# Patient Record
Sex: Male | Born: 1980 | Race: White | Hispanic: No | Marital: Married | Smoking: Never smoker
Health system: Southern US, Community
[De-identification: ages and names within clinical notes are randomized; demographics above are authoritative.]

## PROBLEM LIST (undated history)

## (undated) DIAGNOSIS — IMO0001 Reserved for inherently not codable concepts without codable children: Secondary | ICD-10-CM

## (undated) HISTORY — DX: Reserved for inherently not codable concepts without codable children: IMO0001

---

## 2011-06-06 ENCOUNTER — Encounter: Payer: Self-pay | Admitting: Family Medicine

## 2011-06-20 ENCOUNTER — Ambulatory Visit (INDEPENDENT_AMBULATORY_CARE_PROVIDER_SITE_OTHER): Payer: 59 | Admitting: Family Medicine

## 2011-06-20 ENCOUNTER — Encounter: Payer: Self-pay | Admitting: Family Medicine

## 2011-06-20 DIAGNOSIS — M549 Dorsalgia, unspecified: Secondary | ICD-10-CM

## 2011-06-20 DIAGNOSIS — Z719 Counseling, unspecified: Secondary | ICD-10-CM | POA: Insufficient documentation

## 2011-06-20 DIAGNOSIS — E663 Overweight: Secondary | ICD-10-CM | POA: Insufficient documentation

## 2011-06-20 DIAGNOSIS — S29012A Strain of muscle and tendon of back wall of thorax, initial encounter: Secondary | ICD-10-CM | POA: Insufficient documentation

## 2011-06-20 DIAGNOSIS — R030 Elevated blood-pressure reading, without diagnosis of hypertension: Secondary | ICD-10-CM

## 2011-06-20 DIAGNOSIS — I10 Essential (primary) hypertension: Secondary | ICD-10-CM | POA: Insufficient documentation

## 2011-06-20 DIAGNOSIS — Z7189 Other specified counseling: Secondary | ICD-10-CM

## 2011-06-20 DIAGNOSIS — Z6825 Body mass index (BMI) 25.0-25.9, adult: Secondary | ICD-10-CM

## 2011-06-20 NOTE — Assessment & Plan Note (Addendum)
Most likely discomfort in left upper back and in neck is musculoskeletal in origin. Currently exam within normal limits completely. Recommend Motrin as needed if symptoms flare. We'll give shoulder exercises for patient to do at home for strengthening and stretching. If no improvement or if any worsening patient to return and I can refer him to formal physical therapy

## 2011-06-20 NOTE — Assessment & Plan Note (Signed)
Encouraged weight loss and increased activity. Patient is a nonsmoker. Up-to-date on vaccinations. No screening blood work recommended at this time.

## 2011-06-20 NOTE — Progress Notes (Signed)
  Subjective:    Patient ID: Derrick Jensen, male    DOB: 13-Sep-1980, 30 y.o.   MRN: [id number]  HPI Here for annual physical: Possible history, surgical history, family history, social history, medications, allergies are reviewed and documented under the appropriate tabs.  Weight: Patient currently with a BMI of 30. Has not been working out during the past year as he has in the past. Patient states that he has gained 30 pounds during the past year. Patient goal weight of 200 pounds. He is currently at 230 pounds. States that he is trying to get active again. Also endorses an unhealthy diet and he is working on.  Blood pressure: 148/94, rechecked at end of visit remained same. Patient states he has no history of blood pressure issues. Does not check blood pressure at home. Has not been working out currently as per above.  Shoulder and neck discomfort: Patient describes a burning/dull ache in left scapular area and another similar discomfort in left neck area. Both areas of discomfort are small, approximately a quarter in size. Discomfort increases with patient sitting at rest in office desk or lying down. No discomfort with movement and exercise. Patient states this does not hurt. Area and left scapula there for over one year. Left neck discomfort started in April.   Review of Systems As per above. No weight loss. No fever. No cold symptoms. No dark stools. No bright red blood per rectum. No abdominal pain.    Objective:   Physical Exam  Constitutional: He is oriented to person, place, and time. He appears well-developed and well-nourished.  HENT:  Head: Normocephalic and atraumatic.  Eyes: Conjunctivae are normal. Pupils are equal, round, and reactive to light.  Neck: Normal range of motion. Neck supple.  Cardiovascular: Normal rate, regular rhythm and normal heart sounds.   No murmur heard. Pulmonary/Chest: Effort normal and breath sounds normal. No respiratory distress. He has no  wheezes.  Abdominal: Soft. He exhibits no distension.  Musculoskeletal: He exhibits no edema.       No tenderness on palpation of shoulder area, upper back, or spine. No tenderness on palpation of neck. No skin changes on back or shoulders. Normal sensation throughout. Reflexes equal bilateral in lower extremities. Range of motion within normal limits and shoulders bilateral. Strength 5 out of 5 in upper extremities, and equal bilateral.  Neurological: He is alert and oriented to person, place, and time.  Skin: No rash noted.  Psychiatric: He has a normal mood and affect. His behavior is normal.          Assessment & Plan:

## 2011-06-20 NOTE — Patient Instructions (Signed)
Blood pressure: It was 148/94 today.  Goal is for it to be less than 140/90.  Keep a log book of your blood pressures--if it stays elevated during the next couple of month at home- return for recheck in 2 months.  If it is less than 140/90 than follow up in 1 year for recheck.   Shoulder/neck pain: I think that your discomfort is due to inflammation in these muscles.  Use motrin as needed for discomfort. Do home exercises 2 x day to see if this improves.   If it doesn't improve with home PT, call me and I will refer you to formal PT.  Return if any new or worsening of symptoms.   Weight: Goal weight 200.  Your BMI is currently 30.  Continue to work on increasing exercise and improving nutrition.     Do Shoulder Exercises 2 x per day.   EXERCISES  RANGE OF MOTION (ROM) AND STRETCHING EXERCISES These exercises may help you when beginning to rehabilitate your injury. Your symptoms may resolve with or without further involvement from your physician, physical therapist or athletic trainer. While completing these exercises, remember:   Restoring tissue flexibility helps normal motion to return to the joints. This allows healthier, less painful movement and activity.   An effective stretch should be held for at least 30 seconds.   A stretch should never be painful. You should only feel a gentle lengthening or release in the stretched tissue.  ROM - Pendulum  Bend at the waist so that your right / left arm falls away from your body. Support yourself with your opposite hand on a solid surface, such as a table or a countertop.   Your right / left arm should be perpendicular to the ground. If it is not perpendicular, you need to lean over farther. Relax the muscles in your right / left arm and shoulder as much as possible.   Gently sway your hips and trunk so they move your right / left arm without any use of your right / left shoulder muscles.   Progress your movements so that your right /  left arm moves side to side, then forward and backward, and finally, both clockwise and counterclockwise.   Complete __________ repetitions in each direction. Many people use this exercise to relieve discomfort in their shoulder as well as to gain range of motion.  Repeat __________ times. Complete this exercise __________ times per day. STRETCH - Flexion, Standing  Stand with good posture. With an underhand grip on your right / left hand and an overhand grip on the opposite hand, grasp a broomstick or cane so that your hands are a little more than shoulder-width apart.   Keeping your right / left elbow straight and shoulder muscles relaxed, push the stick with your opposite hand to raise your right / left arm in front of your body and then overhead. Raise your arm until you feel a stretch in your right / left shoulder, but before you have increased shoulder pain.   Try to avoid shrugging your right / left shoulder as your arm rises by keeping your shoulder blade tucked down and toward your mid-back spine. Hold __________ seconds.   Slowly return to the starting position.  Repeat __________ times. Complete this exercise __________ times per day. STRETCH - Internal Rotation  Place your right / left hand behind your back, palm-up.   Throw a towel or belt over your opposite shoulder. Grasp the towel/belt with your right /  left hand.   While keeping an upright posture, gently pull up on the towel/belt until you feel a stretch in the front of your right / left shoulder.   Avoid shrugging your right / left shoulder as your arm rises by keeping your shoulder blade tucked down and toward your mid-back spine.   Hold __________. Release the stretch by lowering your opposite hand.  Repeat __________ times. Complete this exercise __________ times per day. STRETCH - External Rotation and Abduction  Stagger your stance through a doorframe. It does not matter which foot is forward.   As instructed by  your physician, physical therapist or athletic trainer, place your hands:   And forearms above your head and on the door frame.   And forearms at head-height and on the door frame.   At elbow-height and on the door frame.   Keeping your head and chest upright and your stomach muscles tight to prevent over-extending your low-back, slowly shift your weight onto your front foot until you feel a stretch across your chest and/or in the front of your shoulders.   Hold __________ seconds. Shift your weight to your back foot to release the stretch.  Repeat __________ times. Complete this stretch __________ times per day.  STRENGTHENING EXERCISES  These exercises may help you when beginning to rehabilitate your injury. They may resolve your symptoms with or without further involvement from your physician, physical therapist or athletic trainer. While completing these exercises, remember:   Muscles can gain both the endurance and the strength needed for everyday activities through controlled exercises.   Complete these exercises as instructed by your physician, physical therapist or athletic trainer. Progress the resistance and repetitions only as guided.   You may experience muscle soreness or fatigue, but the pain or discomfort you are trying to eliminate should never worsen during these exercises. If this pain does worsen, stop and make certain you are following the directions exactly. If the pain is still present after adjustments, discontinue the exercise until you can discuss the trouble with your clinician.   If advised by your physician, during your recovery, avoid activity or exercises which involve actions that place your right / left hand or elbow above your head or behind your back or head. These positions stress the tissues which are trying to heal.  STRENGTH - Scapular Depression and Adduction  With good posture, sit on a firm chair. Supported your arms in front of you with pillows, arm  rests or a table top. Have your elbows in line with the sides of your body.   Gently draw your shoulder blades down and toward your mid-back spine. Gradually increase the tension without tensing the muscles along the top of your shoulders and the back of your neck.   Hold for __________ seconds. Slowly release the tension and relax your muscles completely before completing the next repetition.   After you have practiced this exercise, remove the arm support and complete it in standing as well as sitting.  Repeat __________ times. Complete this exercise __________ times per day.  STRENGTH - External Rotators  Secure a rubber exercise band/tubing to a fixed object so that it is at the same height as your right / left elbow when you are standing or sitting on a firm surface.   Stand or sit so that the secured exercise band/tubing is at your side that is not injured.   Bend your elbow 90 degrees. Place a folded towel or small pillow under your  right / left arm so that your elbow is a few inches away from your side.   Keeping the tension on the exercise band/tubing, pull it away from your body, as if pivoting on your elbow. Be sure to keep your body steady so that the movement is only coming from your shoulder rotating.   Hold __________ seconds. Release the tension in a controlled manner as you return to the starting position.  Repeat __________ times. Complete this exercise __________ times per day.  STRENGTH - Supraspinatus  Stand or sit with good posture. Grasp a __________ weight or an exercise band/tubing so that your hand is "thumbs-up," like when you shake hands.   Slowly lift your right / left hand from your thigh into the air, traveling about 30 degrees from straight out at your side. Lift your hand to shoulder height or as far as you can without increasing any shoulder pain. Initially, many people do not lift their hands above shoulder height.   Avoid shrugging your right / left  shoulder as your arm rises by keeping your shoulder blade tucked down and toward your mid-back spine.   Hold for __________ seconds. Control the descent of your hand as you slowly return to your starting position.  Repeat __________ times. Complete this exercise __________ times per day.  STRENGTH - Shoulder Extensors  Secure a rubber exercise band/tubing so that it is at the height of your shoulders when you are either standing or sitting on a firm arm-less chair.   With a thumbs-up grip, grasp an end of the band/tubing in each hand. Straighten your elbows and lift your hands straight in front of you at shoulder height. Step back away from the secured end of band/tubing until it becomes tense.   Squeezing your shoulder blades together, pull your hands down to the sides of your thighs. Do not allow your hands to go behind you.   Hold for __________ seconds. Slowly ease the tension on the band/tubing as you reverse the directions and return to the starting position.  Repeat __________ times. Complete this exercise __________ times per day.  STRENGTH - Scapular Retractors  Secure a rubber exercise band/tubing so that it is at the height of your shoulders when you are either standing or sitting on a firm arm-less chair.   With a palm-down grip, grasp an end of the band/tubing in each hand. Straighten your elbows and lift your hands straight in front of you at shoulder height. Step back away from the secured end of band/tubing until it becomes tense.   Squeezing your shoulder blades together, draw your elbows back as you bend them. Keep your upper arm lifted away from your body throughout the exercise.   Hold __________ seconds. Slowly ease the tension on the band/tubing as you reverse the directions and return to the starting position.  Repeat __________ times. Complete this exercise __________ times per day. STRENGTH - Scapular Depressors  Find a sturdy chair without wheels, such as a from a  dining room table.   Keeping your feet on the floor, lift your bottom from the seat and lock your elbows.   Keeping your elbows straight, allow gravity to pull your body weight down. Your shoulders will rise toward your ears.   Raise your body against gravity by drawing your shoulder blades down your back, shortening the distance between your shoulders and ears. Although your feet should always maintain contact with the floor, your feet should progressively support less body weight as  you get stronger.   Hold __________ seconds. In a controlled and slow manner, lower your body weight to begin the next repetition.  Repeat __________ times. Complete this exercise __________ times per day.  Document Released: 05/31/2005 Document Revised: 03/29/2011 Document Reviewed: 10/29/2008 [facility] Patient Information 2012 [place], [place].

## 2011-06-20 NOTE — Assessment & Plan Note (Signed)
Blood pressure 148/94. Patient not checking at home. Patient to monitor blood pressure and return if it remains elevated at home. Blood pressure goal of less than 140/90.

## 2011-06-20 NOTE — Assessment & Plan Note (Signed)
Patient states weight goal of 200 pounds.  BMI currently 30. Weight currently 230 pounds.  Encouraged increased activity and healthy diet.

## 2013-01-02 ENCOUNTER — Ambulatory Visit (INDEPENDENT_AMBULATORY_CARE_PROVIDER_SITE_OTHER): Payer: 59 | Admitting: Family Medicine

## 2013-01-02 ENCOUNTER — Ambulatory Visit (INDEPENDENT_AMBULATORY_CARE_PROVIDER_SITE_OTHER)
Admission: RE | Admit: 2013-01-02 | Discharge: 2013-01-02 | Disposition: A | Discharge status: Home / Self Care | Payer: 59 | Source: Ambulatory Visit | Attending: Family Medicine | Admitting: Family Medicine

## 2013-01-02 ENCOUNTER — Encounter: Payer: Self-pay | Admitting: Family Medicine

## 2013-01-02 ENCOUNTER — Ambulatory Visit: Payer: 59 | Admitting: Family Medicine

## 2013-01-02 VITALS — BP 140/84 | HR 84 | Temp 98.0°F | Ht 71.25 in | Wt 226.0 lb

## 2013-01-02 DIAGNOSIS — M25512 Pain in left shoulder: Secondary | ICD-10-CM

## 2013-01-02 DIAGNOSIS — R030 Elevated blood-pressure reading, without diagnosis of hypertension: Secondary | ICD-10-CM

## 2013-01-02 DIAGNOSIS — Z6825 Body mass index (BMI) 25.0-25.9, adult: Secondary | ICD-10-CM

## 2013-01-02 DIAGNOSIS — E663 Overweight: Secondary | ICD-10-CM

## 2013-01-02 DIAGNOSIS — M25519 Pain in unspecified shoulder: Secondary | ICD-10-CM

## 2013-01-02 DIAGNOSIS — Z8249 Family history of ischemic heart disease and other diseases of the circulatory system: Secondary | ICD-10-CM

## 2013-01-02 DIAGNOSIS — Z136 Encounter for screening for cardiovascular disorders: Secondary | ICD-10-CM

## 2013-01-02 LAB — CBC WITH DIFFERENTIAL/PLATELET
Basophils Relative: 0.6 % (ref 0.0–3.0)
Eosinophils Relative: 2.1 % (ref 0.0–5.0)
Hemoglobin: 16.3 g/dL (ref 13.0–17.0)
Lymphocytes Relative: 21.3 % (ref 12.0–46.0)
Neutrophils Relative %: 67.3 % (ref 43.0–77.0)
RBC: 5.45 Mil/uL (ref 4.22–5.81)
WBC: 6.2 10*3/uL (ref 4.5–10.5)

## 2013-01-02 LAB — COMPREHENSIVE METABOLIC PANEL
ALT: 61 U/L — ABNORMAL HIGH (ref 0–53)
AST: 34 U/L (ref 0–37)
Calcium: 9.5 mg/dL (ref 8.4–10.5)
Chloride: 103 mEq/L (ref 96–112)
Creatinine, Ser: 1.2 mg/dL (ref 0.4–1.5)
Sodium: 140 mEq/L (ref 135–145)
Total Bilirubin: 0.9 mg/dL (ref 0.3–1.2)
Total Protein: 7.3 g/dL (ref 6.0–8.3)

## 2013-01-02 LAB — LIPID PANEL
HDL: 25.3 mg/dL — ABNORMAL LOW (ref >39.00)
VLDL: 78.8 mg/dL — ABNORMAL HIGH (ref 0.0–40.0)

## 2013-01-02 MED ORDER — HYDROCHLOROTHIAZIDE 12.5 MG PO CAPS: 12.5000 mg | ORAL_CAPSULE | ORAL | Status: DC

## 2013-01-02 NOTE — Progress Notes (Addendum)
Subjective:    Patient ID: Derrick Jensen, male    DOB: 1981-04-05, 32 y.o.   MRN: [id number]  HPI  Very pleasant 32 yo male here to establish care.  1.  Elevated BP- think it has been elevated for awhile.  Strong FH of HTN.  Saw Dawn Caviness in 06/2011 and BP was 148/94 at that time. BP is 140/84 today.  At times does feel face is a little flushed with exertion, has felt heart flutter. No HA, blurred vision or LE edema.  2.  Left posterior shoulder pain- ongoing for years.  Comes and goes but when it is happening it is constant.  No movement makes it worse.  Can radiate to arm but not fingers/hand. No known injury.  3.  FH of CAD- dad had first MI in 36s.  Paternal grandfather died of MI in his 54s. He denies any significant DOE- states he is "out of shape."  Knows he is overweight and trying to exercise more regularly.  Patient Active Problem List   Diagnosis Date Noted  . Family history of premature CAD 01/02/2013  . Left shoulder pain 01/02/2013  . Back pain 06/20/2011  . Overweight (BMI 25.0-29.9) 06/20/2011  . Blood pressure elevated 06/20/2011  . Health counseling 06/20/2011   Past Medical History  Diagnosis Date  . No significant past medical history    No past surgical history on file. History  Substance Use Topics  . Smoking status: Never Smoker   . Smokeless tobacco: Not on file  . Alcohol Use: Yes     Comment: 1 drink every 3 weeks   Family History  Problem Relation Age of Onset  . Diabetes Father     type 2  . Cancer Father     Stage 4 throat cancer  . Hyperlipidemia Father   . Hypertension Father   . Heart disease Father     MI x2, CAD  . Alzheimer's disease Father    No Known Allergies No current outpatient prescriptions on file prior to visit.   No current facility-administered medications on file prior to visit.   The PMH, PSH, Social History, Family History, Medications, and allergies have been reviewed in [facility], and have been updated if  relevant.     Review of Systems See HPI Patient reports no  vision/ hearing changes,anorexia, weight change, fever ,adenopathy, persistant / recurrent hoarseness, swallowing issues, chest pain, edema,persistant / recurrent cough, hemoptysis, dyspnea(rest, exertional, paroxysmal nocturnal), gastrointestinal  bleeding (melena, rectal bleeding), abdominal pain, excessive heart burn, GU symptoms(dysuria, hematuria, pyuria, voiding/incontinence  Issues) syncope, focal weakness, severe memory loss, concerning skin lesions, depression, anxiety, abnormal bruising/bleeding, major joint swelling.       Objective:   Physical Exam BP 140/84  Pulse 84  Temp(Src) 98 F (36.7 C)  Ht 5' 11.25" (1.81 m)  Wt 226 lb (102.513 kg)  BMI 31.29 kg/m2 General:  overweght male in NAD Eyes:  PERRL Ears:  External ear exam shows no significant lesions or deformities.  Otoscopic examination reveals clear canals, tympanic membranes are intact bilaterally without bulging, retraction, inflammation or discharge. Hearing is grossly normal bilaterally. Nose:  External nasal examination shows no deformity or inflammation. Nasal mucosa are pink and moist without lesions or exudates. Mouth:  Oral mucosa and oropharynx without lesions or exudates.  Teeth in good repair. Neck:  no carotid bruit or thyromegaly no cervical or supraclavicular lymphadenopathy  Lungs:  Normal respiratory effort, chest expands symmetrically. Lungs are clear to auscultation, no crackles or wheezes.  Heart:  Normal rate and regular rhythm. S1 and S2 normal without gallop, murmur, click, rub or other extra sounds. Abdomen:  Bowel sounds positive,abdomen soft and non-tender without masses, organomegaly or hernias noted. Extremities:  no edema  MSK: shoulder is normal to inspection and palpation, neg arch, neg empty can.    Assessment & Plan:  1. Overweight (BMI 25.0-29.9) He is becoming more active, trying to lose weight.  2. Blood pressure  elevated Given persistent elevation of BP and strong FH, will start HCTZ 12.5 mg daily. Follow up in 2 weeks. - Comprehensive metabolic panel - CBC with Differential  3. Family history of premature CAD EKG reassuring- NSR. Will check lipid panel for further risk stratification. Non smoker. - EKG 12-Lead  4. Left shoulder pain Chronic- seems more subscapular.  Will check xray today.  Likely refer to Dr. Patsy Lager for further work up. - DG Shoulder Left; Future  5. Screening for ischemic heart disease  - Lipid Panel

## 2013-01-02 NOTE — Patient Instructions (Addendum)
It was very nice to meet you, Derrick Jensen. I will call you with your xray and labs results.  We are starting Hydrochlorothiazide 12.5 mg daily- please follow up with me in 2 weeks.

## 2013-01-16 ENCOUNTER — Encounter: Payer: Self-pay | Admitting: Family Medicine

## 2013-01-16 ENCOUNTER — Ambulatory Visit (INDEPENDENT_AMBULATORY_CARE_PROVIDER_SITE_OTHER): Payer: 59 | Admitting: Family Medicine

## 2013-01-16 ENCOUNTER — Encounter: Payer: Self-pay | Admitting: *Deleted

## 2013-01-16 VITALS — BP 102/82 | HR 76 | Temp 98.1°F | Wt 228.0 lb

## 2013-01-16 DIAGNOSIS — I10 Essential (primary) hypertension: Secondary | ICD-10-CM

## 2013-01-16 DIAGNOSIS — E781 Pure hyperglyceridemia: Secondary | ICD-10-CM

## 2013-01-16 LAB — LDL CHOLESTEROL, DIRECT: Direct LDL: 122.5 mg/dL

## 2013-01-16 LAB — BASIC METABOLIC PANEL
CO2: 28 mEq/L (ref 19–32)
Chloride: 101 mEq/L (ref 96–112)
Glucose, Bld: 102 mg/dL — ABNORMAL HIGH (ref 70–99)
Potassium: 4.1 mEq/L (ref 3.5–5.1)
Sodium: 138 mEq/L (ref 135–145)

## 2013-01-16 LAB — LIPID PANEL
HDL: 32.2 mg/dL — ABNORMAL LOW (ref >39.00)
Total CHOL/HDL Ratio: 6
VLDL: 45.8 mg/dL — ABNORMAL HIGH (ref 0.0–40.0)

## 2013-01-16 NOTE — Patient Instructions (Addendum)
Great to see you. We will contact you with your lab results.

## 2013-01-16 NOTE — Progress Notes (Signed)
  Subjective:    Patient ID: Derrick Jensen, male    DOB: January 24, 1981, 32 y.o.   MRN: [id number]  HPI  Very pleasant 32 yo male here for two week follow up.  HTN- BP has been elevated for several years and he was symptomatic. Started HCTZ 12.5 mg daily. Denies any HA, blurred vision, CP or SOB.  Feels like BP is under better control.  TG were elevated but he was not fasting.  He is fasting today.  Lab Results  Component Value Date   CHOL 181 01/02/2013   HDL 25.30* 01/02/2013   LDLDIRECT 106.4 01/02/2013   TRIG 394.0* 01/02/2013   CHOLHDL 7 01/02/2013    Patient Active Problem List   Diagnosis Date Noted  . Family history of premature CAD 01/02/2013  . Left shoulder pain 01/02/2013  . Back pain 06/20/2011  . Overweight (BMI 25.0-29.9) 06/20/2011  . HTN (hypertension) 06/20/2011  . Health counseling 06/20/2011   Past Medical History  Diagnosis Date  . No significant past medical history    No past surgical history on file. History  Substance Use Topics  . Smoking status: Never Smoker   . Smokeless tobacco: Not on file  . Alcohol Use: Yes     Comment: 1 drink every 3 weeks   Family History  Problem Relation Age of Onset  . Diabetes Father     type 2  . Cancer Father     Stage 4 throat cancer  . Hyperlipidemia Father   . Hypertension Father   . Heart disease Father     MI x2, CAD  . Alzheimer's disease Father    No Known Allergies Current Outpatient Prescriptions on File Prior to Visit  Medication Sig Dispense Refill  . hydrochlorothiazide (MICROZIDE) 12.5 MG capsule Take 1 capsule (12.5 mg total) by mouth every morning.  30 capsule  0   No current facility-administered medications on file prior to visit.   The PMH, PSH, Social History, Family History, Medications, and allergies have been reviewed in [facility], and have been updated if relevant.     Review of Systems See HPI       Objective:   Physical Exam BP 102/82  Pulse 76  Temp(Src) 98.1 F (36.7 C)  Wt  228 lb (103.42 kg)  BMI 31.57 kg/m2 General:  overweght male in NAD Eyes:  PERRL Ears:  External ear exam shows no significant lesions or deformities.  Otoscopic examination reveals clear canals, tympanic membranes are intact bilaterally without bulging, retraction, inflammation or discharge. Hearing is grossly normal bilaterally. Nose:  External nasal examination shows no deformity or inflammation. Nasal mucosa are pink and moist without lesions or exudates. Mouth:  Oral mucosa and oropharynx without lesions or exudates.  Teeth in good repair. Neck:  no carotid bruit or thyromegaly no cervical or supraclavicular lymphadenopathy  Lungs:  Normal respiratory effort, chest expands symmetrically. Lungs are clear to auscultation, no crackles or wheezes. Heart:  Normal rate and regular rhythm. S1 and S2 normal without gallop, murmur, click, rub or other extra sounds. Abdomen:  Bowel sounds positive,abdomen soft and non-tender without masses, organomegaly or hernias noted. Extremities:  no edema      Assessment & Plan:   1. HTN (hypertension) Much improved! Continue current dose of HCTZ. - Basic Metabolic Panel  2. Hypertriglyceridemia Recheck fasting labs today. - Lipid Panel

## 2013-01-30 ENCOUNTER — Other Ambulatory Visit: Payer: Self-pay | Admitting: Family Medicine

## 2013-05-15 ENCOUNTER — Other Ambulatory Visit: Payer: Self-pay

## 2013-05-15 MED ORDER — HYDROCHLOROTHIAZIDE 12.5 MG PO CAPS: ORAL_CAPSULE | ORAL | Status: DC

## 2013-05-15 NOTE — Telephone Encounter (Signed)
Pt is going to start using a mail order pharmacy (pt does not know name of pharmacy yet). Pt request 90 day written rx for HCTZ. Call when ready for pick up.

## 2013-05-16 MED ORDER — HYDROCHLOROTHIAZIDE 12.5 MG PO CAPS: ORAL_CAPSULE | ORAL | Status: DC

## 2013-05-16 NOTE — Telephone Encounter (Signed)
RX placed at front desk and pt notified. 

## 2013-05-16 NOTE — Addendum Note (Signed)
Addended by: Criselda Peaches B on: 05/16/2013 01:37 PM   Modules accepted: Orders

## 2013-05-19 NOTE — Telephone Encounter (Signed)
Pt request status of prescription; advised ready for pick up at front desk.

## 2014-01-21 ENCOUNTER — Other Ambulatory Visit: Payer: Self-pay | Admitting: *Deleted

## 2014-02-23 ENCOUNTER — Other Ambulatory Visit: Payer: Self-pay | Admitting: Family Medicine

## 2014-02-24 ENCOUNTER — Other Ambulatory Visit: Payer: Self-pay

## 2014-02-24 MED ORDER — HYDROCHLOROTHIAZIDE 12.5 MG PO CAPS: ORAL_CAPSULE | ORAL | Status: DC

## 2014-02-24 NOTE — Telephone Encounter (Signed)
Pt request refill on HCTZ; pt has appt 03/11/14 and [facility] last got refill # 30 on 01/15/14. [facility] and pt will update refills at appt.

## 2014-02-25 ENCOUNTER — Other Ambulatory Visit: Payer: Self-pay | Admitting: Family Medicine

## 2014-02-25 DIAGNOSIS — Z8249 Family history of ischemic heart disease and other diseases of the circulatory system: Secondary | ICD-10-CM

## 2014-02-25 DIAGNOSIS — E781 Pure hyperglyceridemia: Secondary | ICD-10-CM

## 2014-03-04 ENCOUNTER — Other Ambulatory Visit (INDEPENDENT_AMBULATORY_CARE_PROVIDER_SITE_OTHER): Payer: 59

## 2014-03-04 DIAGNOSIS — E781 Pure hyperglyceridemia: Secondary | ICD-10-CM

## 2014-03-04 DIAGNOSIS — I10 Essential (primary) hypertension: Secondary | ICD-10-CM

## 2014-03-04 LAB — COMPREHENSIVE METABOLIC PANEL
ALBUMIN: 4.7 g/dL (ref 3.5–5.2)
ALK PHOS: 62 U/L (ref 39–117)
ALT: 51 U/L (ref 0–53)
AST: 32 U/L (ref 0–37)
BUN: 10 mg/dL (ref 6–23)
CO2: 32 mEq/L (ref 19–32)
Calcium: 9.3 mg/dL (ref 8.4–10.5)
Chloride: 101 mEq/L (ref 96–112)
Creatinine, Ser: 1.2 mg/dL (ref 0.4–1.5)
GFR: 77.3 mL/min (ref >60.00)
GLUCOSE: 93 mg/dL (ref 70–99)
POTASSIUM: 3.7 meq/L (ref 3.5–5.1)
SODIUM: 139 meq/L (ref 135–145)
TOTAL PROTEIN: 7.3 g/dL (ref 6.0–8.3)
Total Bilirubin: 1.4 mg/dL — ABNORMAL HIGH (ref 0.2–1.2)

## 2014-03-04 LAB — LIPID PANEL
CHOL/HDL RATIO: 7
Cholesterol: 193 mg/dL (ref 0–200)
HDL: 26.8 mg/dL — ABNORMAL LOW (ref >39.00)
NonHDL: 166.2
Triglycerides: 268 mg/dL — ABNORMAL HIGH (ref 0.0–149.0)
VLDL: 53.6 mg/dL — ABNORMAL HIGH (ref 0.0–40.0)

## 2014-03-04 LAB — LDL CHOLESTEROL, DIRECT: LDL DIRECT: 135.7 mg/dL

## 2014-03-04 LAB — HEMOGLOBIN A1C: Hgb A1c MFr Bld: 5.2 % (ref 4.6–6.5)

## 2014-03-11 ENCOUNTER — Encounter: Payer: Self-pay | Admitting: Family Medicine

## 2014-03-11 ENCOUNTER — Ambulatory Visit (INDEPENDENT_AMBULATORY_CARE_PROVIDER_SITE_OTHER): Payer: 59 | Admitting: Family Medicine

## 2014-03-11 ENCOUNTER — Telehealth: Payer: Self-pay | Admitting: Family Medicine

## 2014-03-11 VITALS — BP 106/62 | HR 74 | Temp 98.3°F | Ht 71.5 in | Wt 225.0 lb

## 2014-03-11 DIAGNOSIS — E663 Overweight: Secondary | ICD-10-CM

## 2014-03-11 DIAGNOSIS — Z8249 Family history of ischemic heart disease and other diseases of the circulatory system: Secondary | ICD-10-CM

## 2014-03-11 DIAGNOSIS — I10 Essential (primary) hypertension: Secondary | ICD-10-CM

## 2014-03-11 DIAGNOSIS — Z0000 Encounter for general adult medical examination without abnormal findings: Secondary | ICD-10-CM | POA: Insufficient documentation

## 2014-03-11 DIAGNOSIS — E781 Pure hyperglyceridemia: Secondary | ICD-10-CM

## 2014-03-11 MED ORDER — HYDROCHLOROTHIAZIDE 12.5 MG PO CAPS: ORAL_CAPSULE | ORAL | Status: DC

## 2014-03-11 NOTE — Telephone Encounter (Signed)
Relevant patient education assigned to patient using Emmi. ° °

## 2014-03-11 NOTE — Assessment & Plan Note (Signed)
Deteriorated. He does want to work on diet. Hand out given- low cholesterol diet.

## 2014-03-11 NOTE — Progress Notes (Signed)
Subjective:   Patient ID: Derrick Jensen, male    DOB: January 04, 1981, 33 y.o.   MRN: [id number]  Derrick Jensen is a pleasant 33 y.o. year old male who presents to clinic today with Annual Exam  on 03/11/2014  HPI: HTN- On HCTZ 12.5 mg daily.  Denies any HA, blurred vision, CP or SOB. Lab Results  Component Value Date   CREATININE 1.2 03/04/2014   Lab Results  Component Value Date   CHOL 193 03/04/2014   HDL 26.80* 03/04/2014   LDLDIRECT 135.7 03/04/2014   TRIG 268.0* 03/04/2014   CHOLHDL 7 03/04/2014    HLD- deteriorated. Lab Results  Component Value Date   CHOL 193 03/04/2014   HDL 26.80* 03/04/2014   LDLDIRECT 135.7 03/04/2014   TRIG 268.0* 03/04/2014   CHOLHDL 7 03/04/2014   Has strong FH of premature CAD.  He does play soccer once a week.  Never has any CP, SOB, dizziness or syncope.  No current outpatient prescriptions on file prior to visit.   No current facility-administered medications on file prior to visit.    No Known Allergies  Past Medical History  Diagnosis Date  . No significant past medical history     No past surgical history on file.  Family History  Problem Relation Age of Onset  . Diabetes Father     type 2  . Cancer Father     Stage 4 throat cancer  . Hyperlipidemia Father   . Hypertension Father   . Heart disease Father     MI x2, CAD  . Alzheimer's disease Father     History   Social History  . Marital Status: Single    Spouse Name: N/A    Number of Children: N/A  . Years of Education: 16   Occupational History  .  [employer]   Social History Main Topics  . Smoking status: Never Smoker   . Smokeless tobacco: Not on file  . Alcohol Use: Yes     Comment: 1 drink every 3 weeks  . Drug Use: No  . Sexual Activity: Yes    Birth Control/ Protection: Condom     Comment: 1 partner   Other Topics Concern  . Not on file   Social History Narrative   06/2011: Lives with girlfriend and a roommate; college graduate-- went to [employer]-- business  degree-; employed at [employer].   Originally from [place].    The PMH, PSH, Social History, Family History, Medications, and allergies have been reviewed in [facility], and have been updated if relevant.   Review of Systems    See HPI Patient reports no  vision/ hearing changes,anorexia, weight change, fever ,adenopathy, persistant / recurrent hoarseness, swallowing issues, chest pain, edema,persistant / recurrent cough, hemoptysis, dyspnea(rest, exertional, paroxysmal nocturnal), gastrointestinal  bleeding (melena, rectal bleeding), abdominal pain, excessive heart burn, GU symptoms(dysuria, hematuria, pyuria, voiding/incontinence  Issues) syncope, focal weakness, severe memory loss, concerning skin lesions, depression, anxiety, abnormal bruising/bleeding, major joint swelling.    Objective:    BP 106/62  Pulse 74  Temp(Src) 98.3 F (36.8 C) (Oral)  Ht 5' 11.5" (1.816 m)  Wt 225 lb (102.059 kg)  BMI 30.95 kg/m2  SpO2 98%   Physical Exam General:  overweght male in NAD Eyes:  PERRL Ears:  External ear exam shows no significant lesions or deformities.  Otoscopic examination reveals clear canals, tympanic membranes are intact bilaterally without bulging, retraction, inflammation or discharge. Hearing is grossly normal bilaterally. Nose:  External nasal examination shows no deformity or inflammation. Nasal mucosa are pink and moist without lesions or exudates. Mouth:  Oral mucosa and oropharynx without lesions or exudates.  Teeth in good repair. Neck:  no carotid bruit or thyromegaly no cervical or supraclavicular lymphadenopathy  Lungs:  Normal respiratory effort, chest expands symmetrically. Lungs are clear to auscultation, no crackles or wheezes. Heart:  Normal rate and regular rhythm. S1 and S2 normal without gallop, murmur, click, rub or other extra sounds. Abdomen:  Bowel sounds positive,abdomen soft and non-tender without masses, organomegaly or hernias  noted. Pulses:  R and L posterior tibial pulses are full and equal bilaterally  Extremities:  no edema         Assessment & Plan:   Routine general medical examination at a health care facility  Overweight  Essential hypertension  Family history of premature CAD No Follow-up on file.

## 2014-03-11 NOTE — Assessment & Plan Note (Signed)
Discussed dietary changes. Also has low HDL- he would prefer to work on lifestyle changes and follow up in 3-6 months.

## 2014-03-11 NOTE — Assessment & Plan Note (Signed)
Reviewed preventive care protocols, scheduled due services, and updated immunizations Discussed nutrition, exercise, diet, and healthy lifestyle.  

## 2014-03-11 NOTE — Progress Notes (Signed)
Pre visit review using our clinic review tool, if applicable. No additional management support is needed unless otherwise documented below in the visit note. 

## 2014-03-11 NOTE — Patient Instructions (Signed)
Great to see you, Derrick Jensen. Your blood pressure looks good. Let's work on your cholesterol and plan to recheck it in 3-6 months.

## 2014-03-11 NOTE — Assessment & Plan Note (Signed)
Stable on HCTZ 12.5 mg daily. Continue current dose- eRx sent.

## 2014-03-26 ENCOUNTER — Other Ambulatory Visit: Payer: Self-pay | Admitting: Family Medicine

## 2014-09-11 ENCOUNTER — Other Ambulatory Visit: Payer: Self-pay | Admitting: Internal Medicine

## 2014-09-11 DIAGNOSIS — E785 Hyperlipidemia, unspecified: Secondary | ICD-10-CM

## 2014-09-16 ENCOUNTER — Other Ambulatory Visit (INDEPENDENT_AMBULATORY_CARE_PROVIDER_SITE_OTHER): Payer: 59

## 2014-09-16 DIAGNOSIS — E785 Hyperlipidemia, unspecified: Secondary | ICD-10-CM

## 2014-09-16 LAB — COMPREHENSIVE METABOLIC PANEL
ALBUMIN: 4.6 g/dL (ref 3.5–5.2)
ALT: 46 U/L (ref 0–53)
AST: 25 U/L (ref 0–37)
Alkaline Phosphatase: 56 U/L (ref 39–117)
BILIRUBIN TOTAL: 1 mg/dL (ref 0.2–1.2)
BUN: 15 mg/dL (ref 6–23)
CO2: 28 meq/L (ref 19–32)
Calcium: 9.5 mg/dL (ref 8.4–10.5)
Chloride: 105 mEq/L (ref 96–112)
Creatinine, Ser: 1.09 mg/dL (ref 0.40–1.50)
GFR: 83.59 mL/min (ref >60.00)
GLUCOSE: 100 mg/dL — AB (ref 70–99)
POTASSIUM: 4.1 meq/L (ref 3.5–5.1)
SODIUM: 139 meq/L (ref 135–145)
Total Protein: 6.9 g/dL (ref 6.0–8.3)

## 2014-09-16 LAB — LIPID PANEL
Cholesterol: 174 mg/dL (ref 0–200)
HDL: 29.1 mg/dL — AB (ref >39.00)
LDL CALC: 107 mg/dL — AB (ref 0–99)
NonHDL: 144.9
TRIGLYCERIDES: 191 mg/dL — AB (ref 0.0–149.0)
Total CHOL/HDL Ratio: 6
VLDL: 38.2 mg/dL (ref 0.0–40.0)

## 2014-09-22 ENCOUNTER — Ambulatory Visit (INDEPENDENT_AMBULATORY_CARE_PROVIDER_SITE_OTHER): Payer: 59 | Admitting: Family Medicine

## 2014-09-22 ENCOUNTER — Encounter: Payer: Self-pay | Admitting: Family Medicine

## 2014-09-22 VITALS — BP 128/66 | HR 98 | Temp 98.6°F | Ht 71.25 in | Wt 218.2 lb

## 2014-09-22 DIAGNOSIS — Z8249 Family history of ischemic heart disease and other diseases of the circulatory system: Secondary | ICD-10-CM

## 2014-09-22 DIAGNOSIS — E781 Pure hyperglyceridemia: Secondary | ICD-10-CM

## 2014-09-22 DIAGNOSIS — Z0000 Encounter for general adult medical examination without abnormal findings: Secondary | ICD-10-CM

## 2014-09-22 DIAGNOSIS — I10 Essential (primary) hypertension: Secondary | ICD-10-CM

## 2014-09-22 NOTE — Progress Notes (Signed)
Pre visit review using our clinic review tool, if applicable. No additional management support is needed unless otherwise documented below in the visit note. 

## 2014-09-22 NOTE — Assessment & Plan Note (Signed)
Reviewed preventive care protocols, scheduled due services, and updated immunizations Discussed nutrition, exercise, diet, and healthy lifestyle.  

## 2014-09-22 NOTE — Assessment & Plan Note (Signed)
Much improved with weight loss and stopping sodas. Congratulated him on his success.

## 2014-09-22 NOTE — Progress Notes (Signed)
Subjective:   Patient ID: Derrick Jensen, male    DOB: 08-31-1980, 34 y.o.   MRN: [id number]  Derrick ReKorey Ende is a pleasant 34 y.o. year old male who presents to clinic today with Annual Exam  on 09/22/2014  HPI:  Doing well.  Got engaged this fall- getting married on 06/18/15.  HTN- was on HCTZ 12.5 mg daily but stopped taking it because he was feeling dizzy.  Those symptoms have since resolved.  Denies any HA, blurred vision, CP or SOB. Lab Results  Component Value Date   CREATININE 1.09 09/16/2014   Lab Results  Component Value Date   CHOL 174 09/16/2014   HDL 29.10* 09/16/2014   LDLCALC 107* 09/16/2014   LDLDIRECT 135.7 03/04/2014   TRIG 191.0* 09/16/2014   CHOLHDL 6 09/16/2014    HLD- improved!  Lab Results  Component Value Date   CHOL 174 09/16/2014   HDL 29.10* 09/16/2014   LDLCALC 107* 09/16/2014   LDLDIRECT 135.7 03/04/2014   TRIG 191.0* 09/16/2014   CHOLHDL 6 09/16/2014   Has strong FH of premature CAD.  He did play soccer but has not restarted due to the weather. Stop drinking mountain dew and has lost 10 pounds. Wt Readings from Last 3 Encounters:  09/22/14 218 lb 4 oz (98.998 kg)  03/11/14 225 lb (102.059 kg)  01/16/13 228 lb (103.42 kg)    Current Outpatient Prescriptions on File Prior to Visit  Medication Sig Dispense Refill  . hydrochlorothiazide (MICROZIDE) 12.5 MG capsule TAKE ONE CAPSULE BY MOUTH EVERY MORNING (Patient not taking: Reported on 09/22/2014) 30 capsule 5   No current facility-administered medications on file prior to visit.    No Known Allergies  Past Medical History  Diagnosis Date  . No significant past medical history     History reviewed. No pertinent past surgical history.  Family History  Problem Relation Age of Onset  . Diabetes Father     type 2  . Cancer Father     Stage 4 throat cancer  . Hyperlipidemia Father   . Hypertension Father   . Heart disease Father     MI x2, CAD  . Alzheimer's disease Father      History   Social History  . Marital Status: Single    Spouse Name: N/A  . Number of Children: N/A  . Years of Education: 16   Occupational History  .  [employer]   Social History Main Topics  . Smoking status: Never Smoker   . Smokeless tobacco: Not on file  . Alcohol Use: Yes     Comment: 1 drink every 3 weeks  . Drug Use: No  . Sexual Activity: Yes    Birth Control/ Protection: Condom     Comment: 1 partner   Other Topics Concern  . Not on file   Social History Narrative   06/2011: Lives with girlfriend and a roommate; college graduate-- went to [employer]-- business degree-; employed at [employer].   [place]    The PMH, PSH, Social History, Family History, Medications, and allergies [facility], and have been updated if relevant.   Review of Systems    See HPI Patient reports no  vision/ hearing changes,anorexia, weight change, fever ,adenopathy, persistant / recurrent hoarseness, swallowing issues, chest pain, edema,persistant / recurrent cough, hemoptysis, dyspnea(rest, exertional, paroxysmal nocturnal), gastrointestinal  bleeding (melena, rectal bleeding), abdominal pain, excessive heart burn, GU symptoms(dysuria, hematuria, pyuria, voiding/incontinence  Issues) syncope,  focal weakness, severe memory loss, concerning skin lesions, depression, anxiety, abnormal bruising/bleeding, major joint swelling.    Objective:    BP 128/66 mmHg  Pulse 98  Temp(Src) 98.6 F (37 C) (Oral)  Ht 5' 11.25" (1.81 m)  Wt 218 lb 4 oz (98.998 kg)  BMI 30.22 kg/m2  SpO2 97%   Physical Exam General:  overweght male in NAD Eyes:  PERRL Ears:  External ear exam shows no significant lesions or deformities.  Otoscopic examination reveals clear canals, tympanic membranes are intact bilaterally without bulging, retraction, inflammation or discharge. Hearing is grossly normal bilaterally. Nose:  External nasal examination shows no  deformity or inflammation. Nasal mucosa are pink and moist without lesions or exudates. Mouth:  Oral mucosa and oropharynx without lesions or exudates.  Teeth in good repair. Neck:  no carotid bruit or thyromegaly no cervical or supraclavicular lymphadenopathy  Lungs:  Normal respiratory effort, chest expands symmetrically. Lungs are clear to auscultation, no crackles or wheezes. Heart:  Normal rate and regular rhythm. S1 and S2 normal without gallop, murmur, click, rub or other extra sounds. Abdomen:  Bowel sounds positive,abdomen soft and non-tender without masses, organomegaly or hernias noted. Pulses:  R and L posterior tibial pulses are full and equal bilaterally  Extremities:  no edema         Assessment & Plan:   Routine general medical examination at a health care facility  Hypertriglyceridemia  Family history of premature CAD  Essential hypertension No Follow-up on file.

## 2014-09-22 NOTE — Assessment & Plan Note (Signed)
Resolved with weight loss. D/c HCTZ. Call or return to clinic prn if these symptoms worsen or fail to improve as anticipated. The patient indicates understanding of these issues and agrees with the plan.

## 2015-01-21 ENCOUNTER — Ambulatory Visit (INDEPENDENT_AMBULATORY_CARE_PROVIDER_SITE_OTHER): Payer: 59 | Admitting: Family Medicine

## 2015-01-21 ENCOUNTER — Encounter: Payer: Self-pay | Admitting: Family Medicine

## 2015-01-21 VITALS — BP 108/74 | HR 76 | Temp 98.5°F | Wt 225.5 lb

## 2015-01-21 DIAGNOSIS — S29012A Strain of muscle and tendon of back wall of thorax, initial encounter: Secondary | ICD-10-CM

## 2015-01-21 NOTE — Progress Notes (Signed)
Pre visit review using our clinic review tool, if applicable. No additional management support is needed unless otherwise documented below in the visit note. 

## 2015-01-21 NOTE — Assessment & Plan Note (Signed)
Multiple possible inciting situations over the past week. NSAID was not very helpful but getting better on its own. Actually today markedly improved after lunch. Declines muscle relaxant. Provided with stretching mid back exercises from [facility] pt advisor. Update if persistent or worsening sxs. Pt agrees with plan.

## 2015-01-21 NOTE — Patient Instructions (Signed)
i think we teaked back which led to muscle spasm on right. I'm glad it's getting better. If recurrent, let me know for muscle relaxant course. Do exercises provided today.

## 2015-01-21 NOTE — Progress Notes (Signed)
BP 108/74 mmHg  Pulse 76  Temp(Src) 98.5 F (36.9 C) (Oral)  Wt 225 lb 8 oz (102.286 kg)  SpO2 98%   CC: back pain  Subjective:    Patient ID: Derrick Jensen, male    DOB: 09-10-1980, 34 y.o.   MRN: [id number]  HPI: Derrick Jensen is a 34 y.o. male presenting on 01/21/2015 for Back Pain   4d h/o left mid back pain described as knot sensation. initially stiff in lower back. Worse with movement. Today after lunch actually started feeling better. No fevers/chills, abd pain, dysuria, hematuria.   Tried sleeping in different floor, heating pad, ibuprofen. Nothing helped.   He did stay in different bed over weekend, played golf, went camping - may have tweaked back in a different bed.  Has had pain like this in the past but quickly resolved.   Relevant past medical, surgical, family and social history reviewed and updated as indicated. Interim medical history since our last visit reviewed. Allergies and medications reviewed and updated. No current outpatient prescriptions on file prior to visit.   No current facility-administered medications on file prior to visit.    Review of Systems Per HPI unless specifically indicated above     Objective:    BP 108/74 mmHg  Pulse 76  Temp(Src) 98.5 F (36.9 C) (Oral)  Wt 225 lb 8 oz (102.286 kg)  SpO2 98%  Wt Readings from Last 3 Encounters:  01/21/15 225 lb 8 oz (102.286 kg)  09/22/14 218 lb 4 oz (98.998 kg)  03/11/14 225 lb (102.059 kg)    Physical Exam  Constitutional: He appears well-developed and well-nourished. No distress.  Musculoskeletal: He exhibits no edema.  No pain midline spine No significant paraspinous mm tenderness Neg SLR bilaterally. Main discomfort at R CVA but no knot or spasm appreciated today. No CVA tenderness FROM at bilateral shoulders  Skin: Skin is warm and dry. No rash noted.  Psychiatric: He has a normal mood and affect.  Nursing note and vitals reviewed.  Results for orders placed or performed in  visit on 09/16/14  Comprehensive metabolic panel  Result Value Ref Range   Sodium 139 135 - 145 mEq/L   Potassium 4.1 3.5 - 5.1 mEq/L   Chloride 105 96 - 112 mEq/L   CO2 28 19 - 32 mEq/L   Glucose, Bld 100 (H) 70 - 99 mg/dL   BUN 15 6 - 23 mg/dL   Creatinine, Ser 2.95 0.40 - 1.50 mg/dL   Total Bilirubin 1.0 0.2 - 1.2 mg/dL   Alkaline Phosphatase 56 39 - 117 U/L   AST 25 0 - 37 U/L   ALT 46 0 - 53 U/L   Total Protein 6.9 6.0 - 8.3 g/dL   Albumin 4.6 3.5 - 5.2 g/dL   Calcium 9.5 8.4 - 28.4 mg/dL   GFR 13.24 >40.10 mL/min  Lipid panel  Result Value Ref Range   Cholesterol 174 0 - 200 mg/dL   Triglycerides 272.5 (H) 0.0 - 149.0 mg/dL   HDL 36.64 (L) >40.34 mg/dL   VLDL 74.2 0.0 - 59.5 mg/dL   LDL Cholesterol 638 (H) 0 - 99 mg/dL   Total CHOL/HDL Ratio 6    NonHDL 144.90       Assessment & Plan:   Problem List Items Addressed This Visit    Strain of latissimus dorsi muscle - Primary    Multiple possible inciting situations over the past week. NSAID was not very helpful but getting better on its own.  Actually today markedly improved after lunch. Declines muscle relaxant. Provided with stretching mid back exercises from [facility] pt advisor. Update if persistent or worsening sxs. Pt agrees with plan.          Follow up plan: Return if symptoms worsen or fail to improve.

## 2016-09-11 ENCOUNTER — Other Ambulatory Visit: Payer: Self-pay | Admitting: Family Medicine

## 2016-09-11 DIAGNOSIS — Z0000 Encounter for general adult medical examination without abnormal findings: Secondary | ICD-10-CM

## 2016-09-19 ENCOUNTER — Other Ambulatory Visit (INDEPENDENT_AMBULATORY_CARE_PROVIDER_SITE_OTHER): Payer: 59

## 2016-09-19 DIAGNOSIS — R7989 Other specified abnormal findings of blood chemistry: Secondary | ICD-10-CM | POA: Diagnosis not present

## 2016-09-19 DIAGNOSIS — Z0000 Encounter for general adult medical examination without abnormal findings: Secondary | ICD-10-CM | POA: Diagnosis not present

## 2016-09-19 LAB — CBC WITH DIFFERENTIAL/PLATELET
Basophils Absolute: 0 10*3/uL (ref 0.0–0.1)
Basophils Relative: 0.7 % (ref 0.0–3.0)
EOS PCT: 2.1 % (ref 0.0–5.0)
Eosinophils Absolute: 0.1 10*3/uL (ref 0.0–0.7)
HCT: 45.3 % (ref 39.0–52.0)
Hemoglobin: 15.9 g/dL (ref 13.0–17.0)
LYMPHS ABS: 1.6 10*3/uL (ref 0.7–4.0)
Lymphocytes Relative: 25.5 % (ref 12.0–46.0)
MCHC: 35.1 g/dL (ref 30.0–36.0)
MCV: 83.9 fl (ref 78.0–100.0)
MONOS PCT: 7.5 % (ref 3.0–12.0)
Monocytes Absolute: 0.5 10*3/uL (ref 0.1–1.0)
NEUTROS PCT: 64.2 % (ref 43.0–77.0)
Neutro Abs: 4 10*3/uL (ref 1.4–7.7)
PLATELETS: 231 10*3/uL (ref 150.0–400.0)
RBC: 5.4 Mil/uL (ref 4.22–5.81)
RDW: 13 % (ref 11.5–15.5)
WBC: 6.2 10*3/uL (ref 4.0–10.5)

## 2016-09-19 LAB — LDL CHOLESTEROL, DIRECT: LDL DIRECT: 120 mg/dL

## 2016-09-19 LAB — COMPREHENSIVE METABOLIC PANEL
ALT: 67 U/L — ABNORMAL HIGH (ref 0–53)
AST: 34 U/L (ref 0–37)
Albumin: 4.8 g/dL (ref 3.5–5.2)
Alkaline Phosphatase: 54 U/L (ref 39–117)
BUN: 12 mg/dL (ref 6–23)
CHLORIDE: 104 meq/L (ref 96–112)
CO2: 30 meq/L (ref 19–32)
Calcium: 9.4 mg/dL (ref 8.4–10.5)
Creatinine, Ser: 1.03 mg/dL (ref 0.40–1.50)
GFR: 88.12 mL/min (ref >60.00)
GLUCOSE: 106 mg/dL — AB (ref 70–99)
POTASSIUM: 4.1 meq/L (ref 3.5–5.1)
SODIUM: 140 meq/L (ref 135–145)
Total Bilirubin: 0.9 mg/dL (ref 0.2–1.2)
Total Protein: 7 g/dL (ref 6.0–8.3)

## 2016-09-19 LAB — LIPID PANEL
Cholesterol: 188 mg/dL (ref 0–200)
HDL: 30.6 mg/dL — AB (ref >39.00)
NONHDL: 157.24
Total CHOL/HDL Ratio: 6
Triglycerides: 239 mg/dL — ABNORMAL HIGH (ref 0.0–149.0)
VLDL: 47.8 mg/dL — AB (ref 0.0–40.0)

## 2016-09-25 ENCOUNTER — Encounter: Payer: 59 | Admitting: Family Medicine

## 2016-09-28 ENCOUNTER — Encounter: Payer: Self-pay | Admitting: Family Medicine

## 2016-09-28 ENCOUNTER — Ambulatory Visit (INDEPENDENT_AMBULATORY_CARE_PROVIDER_SITE_OTHER): Payer: 59 | Admitting: Family Medicine

## 2016-09-28 ENCOUNTER — Other Ambulatory Visit: Payer: Self-pay | Admitting: Family Medicine

## 2016-09-28 VITALS — BP 110/80 | HR 91 | Temp 98.5°F | Ht 71.75 in | Wt 226.8 lb

## 2016-09-28 DIAGNOSIS — R109 Unspecified abdominal pain: Secondary | ICD-10-CM

## 2016-09-28 DIAGNOSIS — R0683 Snoring: Secondary | ICD-10-CM | POA: Diagnosis not present

## 2016-09-28 DIAGNOSIS — Z0000 Encounter for general adult medical examination without abnormal findings: Secondary | ICD-10-CM | POA: Diagnosis not present

## 2016-09-28 DIAGNOSIS — I10 Essential (primary) hypertension: Secondary | ICD-10-CM

## 2016-09-28 DIAGNOSIS — Z8249 Family history of ischemic heart disease and other diseases of the circulatory system: Secondary | ICD-10-CM

## 2016-09-28 NOTE — Patient Instructions (Signed)
Great to see you.  We will call you with your appointment for a sleep study.

## 2016-09-28 NOTE — Assessment & Plan Note (Signed)
Reviewed preventive care protocols, scheduled due services, and updated immunizations Discussed nutrition, exercise, diet, and healthy lifestyle.  

## 2016-09-28 NOTE — Assessment & Plan Note (Signed)
LDL improved.

## 2016-09-28 NOTE — Assessment & Plan Note (Signed)
Refer to pulmonary to rule out OSA.

## 2016-09-28 NOTE — Progress Notes (Signed)
Subjective:   Patient ID: Derrick Jensen, male    DOB: [birth year], 36 y.o.   MRN: [id number]  Neithan Day is a pleasant 36 y.o. year old male who presents to clinic today with Annual Exam  on 09/28/2016  HPI:  Doing well.  Got married last fall.  No longer taking antihypertensives and remains normotensive.  Has strong FH of premature CAD.  He did play soccer but has not restarted due to the weather.  Lab Results  Component Value Date   CHOL 188 09/19/2016   HDL 30.60 (L) 09/19/2016   LDLCALC 107 (H) 09/16/2014   LDLDIRECT 120.0 09/19/2016   TRIG 239.0 (H) 09/19/2016   CHOLHDL 6 09/19/2016   Lab Results  Component Value Date   NA 140 09/19/2016   K 4.1 09/19/2016   CL 104 09/19/2016   CO2 30 09/19/2016   Lab Results  Component Value Date   ALT 67 (H) 09/19/2016   AST 34 09/19/2016   ALKPHOS 54 09/19/2016   BILITOT 0.9 09/19/2016   Lab Results  Component Value Date   CREATININE 1.03 09/19/2016   No current outpatient prescriptions on file prior to visit.   No current facility-administered medications on file prior to visit.     No Known Allergies  Past Medical History:  Diagnosis Date  . No significant past medical history     No past surgical history on file.  Family History  Problem Relation Age of Onset  . Diabetes Father     type 2  . Cancer Father     Stage 4 throat cancer  . Hyperlipidemia Father   . Hypertension Father   . Heart disease Father     MI x2, CAD  . Alzheimer's disease Father     Social History   Social History  . Marital status: Single    Spouse name: N/A  . Number of children: N/A  . Years of education: 46   Occupational History  .  [employer]   Social History Main Topics  . Smoking status: Never Smoker  . Smokeless tobacco: Never Used  . Alcohol use Yes     Comment: 1 drink every 3 weeks  . Drug use: No  . Sexual activity: Yes    Birth control/ protection: Condom     Comment: 1 partner   Other Topics  Concern  . Not on file   Social History Narrative   06/2011: Lives with girlfriend and a roommate; college graduate-- went to [employer]-- business degree-; employed at [employer].   Originally from [place].    The PMH, PSH, Social History, Family History, Medications, and allergies have been reviewed in [facility], and have been updated if relevant.   Review of Systems  Constitutional: Negative.   HENT: Negative.   Eyes: Negative.   Respiratory: Negative.   Cardiovascular: Negative.   Gastrointestinal: Negative.   Endocrine: Negative.   Genitourinary: Negative.   Musculoskeletal: Negative.   Skin: Negative.   Allergic/Immunologic: Negative.   Neurological: Negative.   Hematological: Negative.   Psychiatric/Behavioral: Negative.   All other systems reviewed and are negative.      Objective:    BP 110/80   Pulse 91   Temp 98.5 F (36.9 C) (Oral)   Ht 5' 11.75" (1.822 m)   Wt 226 lb 12 oz (102.9 kg)   SpO2 98%   BMI 30.97 kg/m   Wt Readings from Last 3 Encounters:  09/28/16 226 lb 12 oz (  102.9 kg)  01/21/15 225 lb 8 oz (102.3 kg)  09/22/14 218 lb 4 oz (99 kg)    Physical Exam  General:  pleasant male in no acute distress Eyes:  PERRL Ears:  External ear exam shows no significant lesions or deformities.  TMs normal bilaterally Hearing is grossly normal bilaterally. Nose:  External nasal examination shows no deformity or inflammation. Nasal mucosa are pink and moist without lesions or exudates. Mouth:  Oral mucosa and oropharynx without lesions or exudates.  Teeth in good repair. Neck:  no carotid bruit or thyromegaly no cervical or supraclavicular lymphadenopathy  Lungs:  Normal respiratory effort, chest expands symmetrically. Lungs are clear to auscultation, no crackles or wheezes. Heart:  Normal rate and regular rhythm. S1 and S2 normal without gallop, murmur, click, rub or other extra sounds. Abdomen:  Bowel sounds positive,abdomen soft and non-tender  without masses, organomegaly or hernias noted. Pulses:  R and L posterior tibial pulses are full and equal bilaterally  Extremities:  no edema  Psych:  Good eye contact, not anxious or depressed appearing       Assessment & Plan:   Routine general medical examination at a health care facility  Essential hypertension  Family history of premature CAD No Follow-up on file.

## 2016-10-16 ENCOUNTER — Ambulatory Visit (HOSPITAL_COMMUNITY)
Admission: RE | Admit: 2016-10-16 | Discharge: 2016-10-16 | Disposition: A | Discharge status: Home / Self Care | Payer: 59 | Source: Ambulatory Visit | Attending: Family Medicine | Admitting: Family Medicine

## 2016-10-16 ENCOUNTER — Telehealth: Payer: Self-pay | Admitting: Family Medicine

## 2016-10-16 DIAGNOSIS — R109 Unspecified abdominal pain: Secondary | ICD-10-CM | POA: Diagnosis present

## 2016-10-16 DIAGNOSIS — K769 Liver disease, unspecified: Secondary | ICD-10-CM | POA: Insufficient documentation

## 2016-10-16 NOTE — Telephone Encounter (Signed)
Patient returned Shannon's call. °

## 2016-10-18 NOTE — Telephone Encounter (Signed)
Patient asked to be called back about ultrasound results.  Patient can be reached at [phone].

## 2016-10-24 ENCOUNTER — Other Ambulatory Visit: Payer: Self-pay | Admitting: Family Medicine

## 2016-10-24 ENCOUNTER — Encounter: Payer: Self-pay | Admitting: Physician Assistant

## 2016-10-24 DIAGNOSIS — K760 Fatty (change of) liver, not elsewhere classified: Secondary | ICD-10-CM

## 2016-10-24 DIAGNOSIS — R1011 Right upper quadrant pain: Secondary | ICD-10-CM

## 2016-10-26 NOTE — Telephone Encounter (Signed)
Called pt back and pt stated someone already notified him with the result

## 2016-11-06 ENCOUNTER — Encounter: Payer: Self-pay | Admitting: Physician Assistant

## 2016-11-06 ENCOUNTER — Ambulatory Visit (INDEPENDENT_AMBULATORY_CARE_PROVIDER_SITE_OTHER): Payer: 59 | Admitting: Physician Assistant

## 2016-11-06 ENCOUNTER — Other Ambulatory Visit (INDEPENDENT_AMBULATORY_CARE_PROVIDER_SITE_OTHER): Payer: 59

## 2016-11-06 VITALS — BP 120/80 | HR 94 | Ht 71.75 in | Wt 232.0 lb

## 2016-11-06 DIAGNOSIS — K760 Fatty (change of) liver, not elsewhere classified: Secondary | ICD-10-CM | POA: Diagnosis not present

## 2016-11-06 DIAGNOSIS — R945 Abnormal results of liver function studies: Principal | ICD-10-CM

## 2016-11-06 DIAGNOSIS — R7989 Other specified abnormal findings of blood chemistry: Secondary | ICD-10-CM

## 2016-11-06 DIAGNOSIS — R1011 Right upper quadrant pain: Secondary | ICD-10-CM | POA: Diagnosis not present

## 2016-11-06 LAB — HEPATIC FUNCTION PANEL
ALT: 65 U/L — AB (ref 0–53)
AST: 33 U/L (ref 0–37)
Albumin: 5 g/dL (ref 3.5–5.2)
Alkaline Phosphatase: 62 U/L (ref 39–117)
BILIRUBIN TOTAL: 0.6 mg/dL (ref 0.2–1.2)
Bilirubin, Direct: 0.1 mg/dL (ref 0.0–0.3)
Total Protein: 7.5 g/dL (ref 6.0–8.3)

## 2016-11-06 NOTE — Progress Notes (Addendum)
Chief Complaint: Fatty Liver, Elevated LFT, RUQ abdominal pain  HPI:  Derrick Jensen is a 36 year old Caucasian male with no significant past medical history,  who was referred to me by Dianne Dun, MD for a complaint of fatty liver, elevated LFTs and right upper quadrant abdominal pain .    Review of records shows an abdominal ultrasound completed 10/16/16 which showed probable fatty infiltration of the liver with focal sparing adjacent to the porta hepatis and otherwise normal negative exam. Prior to this patient had complained of right upper quadrant pain and had a finding of elevated ALT at 67 on 09/19/16.   Today, the patient presents to clinic and tells me that about 3 months ago he started with a constant right upper quadrant abdominal pain which he rated at a 4-5/10. He describes this as somewhat worse after eating, but would last all day long. Most recently after a finding of fatty liver, the patient has altered his diet. He tells me that he typically ate late around 6:30 or 7 pm and would snack into the night before going to sleep, but now he has been eating earlier around 5:30 or 6 and not having any further snacks. This has completely relieved his right upper quadrant abdominal pain.   He is aware findings of fatty liver and is aware that he needs to lose weight. He tells me that he did stop drinking sodas a few months ago, he used to have 3 or 4 a day, but instead was drinking 1 beer a night. He has stopped doing this recently as well.   Patient does tell me that he has a stool one to 2 times a day, they are not watery but towards the softer side. This has been for his entire life. He denies a high-fiber diet.   Patient denies fever, chills, blood in his stool, melena, change in bowel habits, weight loss, fatigue, anorexia, nausea, vomiting heartburn or reflux.     Past Medical History:  Diagnosis Date  . No significant past medical history     History reviewed. No pertinent surgical  history.  No current outpatient prescriptions on file.   No current facility-administered medications for this visit.     Allergies as of 11/06/2016  . (No Known Allergies)    Family History  Problem Relation Age of Onset  . Diabetes Father     type 2  . Cancer Father     Stage 4 throat cancer  . Hyperlipidemia Father   . Hypertension Father   . Heart disease Father     MI x2, CAD  . Alzheimer's disease Father     Social History   Social History  . Marital status: Single    Spouse name: N/A  . Number of children: N/A  . Years of education: 79   Occupational History  .  [employer]   Social History Main Topics  . Smoking status: Never Smoker  . Smokeless tobacco: Never Used  . Alcohol use Yes     Comment: 1 drink every 3 weeks  . Drug use: No  . Sexual activity: Yes    Birth control/ protection: Condom     Comment: 1 partner   Other Topics Concern  . Not on file   Social History Narrative   06/2011: Lives with girlfriend and a roommate; college graduate-- went to [employer]-- business degree-; employed at [employer].   Originally from [place].     Review  of Systems:    Constitutional: No weight loss, fever or chills Skin: No rash or itching Cardiovascular: No chest pain Respiratory: No SOB  Gastrointestinal: See HPI and otherwise negative Genitourinary: No dysuria or change in urinary frequency Neurological: No headache Musculoskeletal: No new muscle or joint pain Hematologic: No bleeding Psychiatric: No history of depression or anxiety   Physical Exam:  Vital signs: BP 120/80   Pulse 94   Ht 5' 11.75" (1.822 m)   Wt 232 lb (105.2 kg)   SpO2 98%   BMI 31.68 kg/m   Constitutional:   Pleasant overweight Caucasian male appears to be in NAD, Well developed, Well nourished, alert and cooperative Head:  Normocephalic and atraumatic. Eyes:   PEERL, EOMI. No icterus. Conjunctiva pink. Ears:  Normal auditory acuity. Neck:   Supple Throat: Oral cavity and pharynx without inflammation, swelling or lesion.  Respiratory: Respirations even and unlabored. Lungs clear to auscultation bilaterally.   No wheezes, crackles, or rhonchi.  Cardiovascular: Normal S1, S2. No MRG. Regular rate and rhythm. No peripheral edema, cyanosis or pallor.  Gastrointestinal:  Soft, nondistended, nontender. No rebound or guarding. Normal bowel sounds. No appreciable masses or hepatomegaly. Rectal:  Not performed.  Msk:  Symmetrical without gross deformities. Without edema, no deformity or joint abnormality.  Neurologic:  Alert and  oriented x4;  grossly normal neurologically.  Skin:   Dry and intact without significant lesions or rashes. Psychiatric:  Demonstrates good judgement and reason without abnormal affect or behaviors.  RELEVANT LABS AND IMAGING: CBC    Component Value Date/Time   WBC 6.2 09/19/2016 0812   RBC 5.40 09/19/2016 0812   HGB 15.9 09/19/2016 0812   HCT 45.3 09/19/2016 0812   PLT 231.0 09/19/2016 0812   MCV 83.9 09/19/2016 0812   MCHC 35.1 09/19/2016 0812   RDW 13.0 09/19/2016 0812   LYMPHSABS 1.6 09/19/2016 0812   MONOABS 0.5 09/19/2016 0812   EOSABS 0.1 09/19/2016 0812   BASOSABS 0.0 09/19/2016 0812    CMP     Component Value Date/Time   NA 140 09/19/2016 0812   K 4.1 09/19/2016 0812   CL 104 09/19/2016 0812   CO2 30 09/19/2016 0812   GLUCOSE 106 (H) 09/19/2016 0812   BUN 12 09/19/2016 0812   CREATININE 1.03 09/19/2016 0812   CALCIUM 9.4 09/19/2016 0812   PROT 7.0 09/19/2016 0812   ALBUMIN 4.8 09/19/2016 0812   AST 34 09/19/2016 0812   ALT 67 (H) 09/19/2016 0812   ALKPHOS 54 09/19/2016 0812   BILITOT 0.9 09/19/2016 0812   ABDOMEN ULTRASOUND COMPLETE 10/16/16  COMPARISON:  None  FINDINGS: Gallbladder: Normally distended without stones or wall thickening. No pericholecystic fluid or sonographic Murphy sign.  Common bile duct: Diameter: Normal caliber 2 mm diameter  Liver: Echogenic,  likely fatty infiltration, though this can be seen with cirrhosis and certain infiltrative disorders. Suspect hepatopetal portal venous flow. Mild focal sparing adjacent to porta hepatis. No discrete hepatic mass.  IVC: Normal appearance  Pancreas: Normal appearance  Spleen: Normal appearance, 11.4 cm length  Right Kidney: Length: 10.8 cm. Normal morphology without mass or hydronephrosis.  Left Kidney: Length: 11.7 cm. Normal morphology without mass or hydronephrosis.  Abdominal aorta: Normal caliber  Other findings: No free fluid  IMPRESSION: Probable fatty infiltration of liver with focal sparing adjacent to porta hepatis as above.  Otherwise negative exam.   Electronically Signed   By: Ulyses Southward M.D.   On: 10/16/2016 08:37  Assessment: 1. Fatty Liver: Seen  at time of recent ultrasound 2. Elevated ALT: On recent labs in February, no repeat recently, consider relation to above versus autoimmune disease versus erroneous 3. RUQ abdominal pain: Was constant over the past 3 months until patient stopped eating late into the night, this has relieved his pain; relation to gastritis versus gallbladder etiology  Plan: 1. Recommend the patient continue what he is doing. We also reviewed briefly some other measures for gastritis. 2. Recommend a slow and steady weight loss of 1-2 pounds a week to reverse the process of fatty liver seen on recent ultrasound. 3. Ordered repeat hepatic function panel. If LFTs return to normal there's no further need for further testing at this time, if they have not we can consider further blood work to include autoimmune processes  4. Patient to return to clinic as recommended after lab results above with myself or Dr. Myrtie Neither as he is the supervising physician today  Hyacinth Meeker, PA-C [facility] 11/06/2016, 2:51 PM  Cc: Dianne Dun, MD   Thank you for sending this case to me. I have reviewed the entire note, and the  outlined plan seems appropriate.  I will follow up his LFT results and check to be sure that he has been screened for viral hepatitis and had iron levels.  Amada Jupiter, MD

## 2016-11-06 NOTE — Patient Instructions (Signed)
If you are age 34 or older, your body mass index should be between 23-30. Your Body mass index is 31.68 kg/m. If this is out of the aforementioned range listed, please consider follow up with your Primary Care Provider.  If you are age 34 or younger, your body mass index should be between 19-25. Your Body mass index is 31.68 kg/m. If this is out of the aformentioned range listed, please consider follow up with your Primary Care Provider.   Your physician has requested that you go to the basement for the following lab work before leaving today: Hepatic panel  High fiber diet handout given to patient.  Follow up as needed.  Thank you for choosing me and [facility].  Hyacinth Meeker, PA-C

## 2016-11-07 ENCOUNTER — Other Ambulatory Visit: Payer: Self-pay

## 2016-11-07 DIAGNOSIS — K7581 Nonalcoholic steatohepatitis (NASH): Secondary | ICD-10-CM

## 2016-11-20 ENCOUNTER — Institutional Professional Consult (permissible substitution): Payer: 59 | Admitting: Internal Medicine

## 2017-02-13 ENCOUNTER — Encounter: Payer: Self-pay | Admitting: Internal Medicine

## 2017-02-13 ENCOUNTER — Ambulatory Visit (INDEPENDENT_AMBULATORY_CARE_PROVIDER_SITE_OTHER): Payer: 59 | Admitting: Internal Medicine

## 2017-02-13 VITALS — BP 118/78 | HR 83 | Ht 71.75 in | Wt 228.2 lb

## 2017-02-13 DIAGNOSIS — R0683 Snoring: Secondary | ICD-10-CM | POA: Diagnosis not present

## 2017-02-13 DIAGNOSIS — G4733 Obstructive sleep apnea (adult) (pediatric): Secondary | ICD-10-CM

## 2017-02-13 NOTE — Patient Instructions (Signed)
Order- schedule unattended home sleep test    Dx OSA  If we don't contact you within a week after your study, please call us, so we can arrange to discuss what to do with the results.

## 2017-02-13 NOTE — Assessment & Plan Note (Signed)
History and physical exam make obstructive sleep apnea likely. We discussed sleep hygiene, the basics of sleep apnea, evaluation and treatment and answered questions. Responsibility for driving safely. Plan-schedule sleep study

## 2017-02-13 NOTE — Progress Notes (Signed)
02/13/17-36 year old male never smoker Referred by Dr. Dayton Martes for snoring. States that he snores loudly at night. Per his wife, he appears to stop breathing at night. Denies ever having a sleep study done. Denies any daytime sleepiness.  His wife tells him that he stops breathing and snores loudly. He feels he gets enough sleep, endorsing an Epworth score of 2/24. Bedtime around 11 PM with short sleep latency waking once or twice before up at 6 AM. One cup of coffee and a couple of glasses of tea during the day. No sleep medicine at night. He is not told of parasomnias, abnormal movement or other sleep disturbances. Denies history of ENT surgery, heart or lung disease, neurologic or thyroid problems. Father has OSA currently untreated.  Prior to Admission medications   Not on File   Past Medical History:  Diagnosis Date  . No significant past medical history    No past surgical history on file. Family History  Problem Relation Age of Onset  . Diabetes Father        type 2  . Cancer Father        Stage 4 throat cancer  . Hyperlipidemia Father   . Hypertension Father   . Heart disease Father        MI x2, CAD  . Alzheimer's disease Father   ' Social History   Social History  . Marital status: Married    Spouse name: N/A  . Number of children: N/A  . Years of education: 63   Occupational History  .  [employer]   Social History Main Topics  . Smoking status: Never Smoker  . Smokeless tobacco: Never Used  . Alcohol use Yes     Comment: 1 drink every 3 weeks  . Drug use: No  . Sexual activity: Yes    Birth control/ protection: Condom     Comment: 1 partner   Other Topics Concern  . Not on file   Social History Narrative   06/2011: Lives with girlfriend and a roommate; college graduate-- went to [employer]-- business degree-; employed at [employer].   Originally from [facility].    ROS-see HPI   + = pos Constitutional:    weight loss, night sweats,  fevers, chills, fatigue, lassitude. HEENT:    headaches, difficulty swallowing, tooth/dental problems, sore throat,       sneezing, itching, ear ache, nasal congestion, post nasal drip, snoring CV:    chest pain, orthopnea, PND, swelling in lower extremities, anasarca,                                                        dizziness, palpitations Resp:   shortness of breath with exertion or at rest.                productive cough,   non-productive cough, coughing up of blood.              change in color of mucus.  wheezing.   Skin:    rash or lesions. GI:  No-   heartburn, indigestion, abdominal pain, nausea, vomiting, diarrhea,                 change in bowel habits, loss of appetite GU: dysuria, change in color of urine, no urgency or  frequency.   flank pain. MS:   joint pain, stiffness, decreased range of motion, back pain. Neuro-     nothing unusual Psych:  change in mood or affect.  depression or anxiety.   memory loss.  OBJ- Physical Exam General- Alert, Oriented, Affect-appropriate, Distress- none acute, not obese, + full beard Skin- rash-none, lesions- none, excoriation- none Lymphadenopathy- none Head- atraumatic            Eyes- Gross vision intact, PERRLA, conjunctivae and secretions clear            Ears- Hearing, canals-normal            Nose- Clear, no-Septal dev, mucus, polyps, erosion, perforation             Throat- Mallampati III-IV , mucosa clear , drainage- none, tonsils- atrophic Neck- flexible , trachea midline, no stridor , thyroid nl, carotid no bruit Chest - symmetrical excursion , unlabored           Heart/CV- RRR , no murmur , no gallop  , no rub, nl s1 s2                           - JVD- none , edema- none, stasis changes- none, varices- none           Lung- clear to P&A, wheeze- none, cough- none , dullness-none, rub- none           Chest wall-  Abd-  Br/ Gen/ Rectal- Not done, not indicated Extrem- cyanosis- none, clubbing, none, atrophy- none,  strength- nl Neuro- grossly intact to observation

## 2017-03-05 DIAGNOSIS — G4733 Obstructive sleep apnea (adult) (pediatric): Secondary | ICD-10-CM | POA: Diagnosis not present

## 2017-03-07 DIAGNOSIS — G4733 Obstructive sleep apnea (adult) (pediatric): Secondary | ICD-10-CM | POA: Diagnosis not present

## 2017-03-08 ENCOUNTER — Other Ambulatory Visit: Payer: Self-pay | Admitting: *Deleted

## 2017-03-08 DIAGNOSIS — G4733 Obstructive sleep apnea (adult) (pediatric): Secondary | ICD-10-CM

## 2017-04-27 ENCOUNTER — Other Ambulatory Visit: Payer: Self-pay | Admitting: *Deleted

## 2017-04-27 DIAGNOSIS — G4733 Obstructive sleep apnea (adult) (pediatric): Secondary | ICD-10-CM

## 2017-07-13 ENCOUNTER — Encounter: Payer: Self-pay | Admitting: Adult Health

## 2017-07-13 ENCOUNTER — Ambulatory Visit (INDEPENDENT_AMBULATORY_CARE_PROVIDER_SITE_OTHER): Payer: 59 | Admitting: Adult Health

## 2017-07-13 DIAGNOSIS — G4733 Obstructive sleep apnea (adult) (pediatric): Secondary | ICD-10-CM | POA: Insufficient documentation

## 2017-07-13 NOTE — Assessment & Plan Note (Addendum)
Change Auto set to 8 to 20cm H20  Trial of full face dream wear - see if more comfortable with beard/mustache. Did not like the nasal mask/pillows.   Plan  Patient Instructions  Continue on CPAP at bedtime Try to wear for at least 4-6 hours each night Work on healthy weight Follow-up with Dr. Maple HudsonYoung in 6 months and as needed

## 2017-07-13 NOTE — Addendum Note (Signed)
Addended by: Boone MasterJONES, JESSICA E on: 07/13/2017 09:34 AM   Modules accepted: Orders

## 2017-07-13 NOTE — Progress Notes (Signed)
 @Patient  ID: Derrick Jensen, male    DOB: 04/02/1981, 36 y.o.   MRN: [id number]  Chief Complaint  Patient presents with  . Follow-up    OSA    Referring provider: Dianne DunAron, Talia M, MD  HPI: 36 year old male followed for obstructive sleep apnea  TEST  HST 03/2017 AHI 13/hr   07/13/2017 Follow up : OSA  Patient presents for a six-month follow-up.  Patient was seen earlier this year for a sleep consult for snoring .  Patient was set up for a home sleep study that showed mild sleep apnea with an AHI at 13\hour.  Patient was started on CPAP.  Patient says it helps his snoring .  He is trying to wear his CPAP each night.  He gets in about 3-4 hours.  Download shows good usage with about 77%.  Average usage at 4 hours.  Patient is on AutoSet 5-20 cm H2O.  AHI 2.1.  Positive leaks.  We discussed CPAP compliance and usage.  Patient education on potential complications of untreated sleep apnea. Says he had some missed days recently due to URI and vacation.  Sometimes feels the pressure is not enough .   No Known Allergies   There is no immunization history on file for this patient.  Past Medical History:  Diagnosis Date  . No significant past medical history     Tobacco History: Social History   Tobacco Use  Smoking Status Never Smoker  Smokeless Tobacco Never Used   Counseling given: Not Answered   No outpatient encounter medications on file as of 07/13/2017.   No facility-administered encounter medications on file as of 07/13/2017.      Review of Systems  Constitutional:   No  weight loss, night sweats,  Fevers, chills, fatigue, or  lassitude.  HEENT:   No headaches,  Difficulty swallowing,  Tooth/dental problems, or  Sore throat,                No sneezing, itching, ear ache, nasal congestion, post nasal drip,   CV:  No chest pain,  Orthopnea, PND, swelling in lower extremities, anasarca, dizziness, palpitations, syncope.   GI  No heartburn, indigestion, abdominal pain,  nausea, vomiting, diarrhea, change in bowel habits, loss of appetite, bloody stools.   Resp: No shortness of breath with exertion or at rest.  No excess mucus, no productive cough,  No non-productive cough,  No coughing up of blood.  No change in color of mucus.  No wheezing.  No chest wall deformity  Skin: no rash or lesions.  GU: no dysuria, change in color of urine, no urgency or frequency.  No flank pain, no hematuria   MS:  No joint pain or swelling.  No decreased range of motion.  No back pain.    Physical Exam  BP 124/86 (BP Location: Left Arm, Cuff Size: Normal)   Pulse (!) 101   Ht 6' (1.829 m)   Wt 225 lb 4.8 oz (102.2 kg)   SpO2 98%   BMI 30.56 kg/m   GEN: A/Ox3; pleasant , NAD, well nourished    HEENT:  [place]/AT,  EACs-clear, TMs-wnl, NOSE-clear, THROAT-clear, no lesions, no postnasal drip or exudate noted. Class 2-3 MP airway   NECK:  Supple w/ fair ROM; no JVD; normal carotid impulses w/o bruits; no thyromegaly or nodules palpated; no lymphadenopathy.    RESP  Clear  P & A; w/o, wheezes/ rales/ or rhonchi. no accessory muscle use, no dullness to percussion  CARD:  RRR, no m/r/g, no peripheral edema, pulses intact, no cyanosis or clubbing.  GI:   Soft & nt; nml bowel sounds; no organomegaly or masses detected.   Musco: Warm bil, no deformities or joint swelling noted.   Neuro: alert, no focal deficits noted.    Skin: Warm, no lesions or rashes    Lab Results:  CBC    Component Value Date/Time   WBC 6.2 09/19/2016 0812   RBC 5.40 09/19/2016 0812   HGB 15.9 09/19/2016 0812   HCT 45.3 09/19/2016 0812   PLT 231.0 09/19/2016 0812   MCV 83.9 09/19/2016 0812   MCHC 35.1 09/19/2016 0812   RDW 13.0 09/19/2016 0812   LYMPHSABS 1.6 09/19/2016 0812   MONOABS 0.5 09/19/2016 0812   EOSABS 0.1 09/19/2016 0812   BASOSABS 0.0 09/19/2016 0812    BMET    Component Value Date/Time   NA 140 09/19/2016 0812   K 4.1 09/19/2016 0812   CL 104 09/19/2016 0812   CO2  30 09/19/2016 0812   GLUCOSE 106 (H) 09/19/2016 0812   BUN 12 09/19/2016 0812   CREATININE 1.03 09/19/2016 0812   CALCIUM 9.4 09/19/2016 0812    BNP No results found for: BNP  ProBNP No results found for: PROBNP  Imaging: No results found.   Assessment & Plan:   No problem-specific Assessment & Plan notes found for this encounter.     Rubye Oaksammy Parrett, NP 07/13/2017

## 2017-07-13 NOTE — Patient Instructions (Signed)
Continue on CPAP at bedtime Try to wear for at least 4-6 hours each night Work on healthy weight Follow-up with Dr. Maple HudsonYoung in 6 months and as needed

## 2018-01-11 ENCOUNTER — Ambulatory Visit: Payer: 59 | Admitting: Internal Medicine

## 2018-05-01 ENCOUNTER — Other Ambulatory Visit: Payer: Self-pay

## 2018-05-01 ENCOUNTER — Telehealth: Payer: Self-pay

## 2018-05-01 DIAGNOSIS — Z0000 Encounter for general adult medical examination without abnormal findings: Secondary | ICD-10-CM

## 2018-05-01 DIAGNOSIS — E781 Pure hyperglyceridemia: Secondary | ICD-10-CM

## 2018-05-01 DIAGNOSIS — I10 Essential (primary) hypertension: Secondary | ICD-10-CM

## 2018-05-01 NOTE — Telephone Encounter (Signed)
Okay to order labs? If so, which ones?       Copied from CRM [id number]. Topic: General - Other >> Apr 29, 2018  3:24 PM Jaquita Rector A wrote: Reason for CRM: Patient called and scheduled an appointment for a Physical Exam for 05/14/18. He would like a call back to clarify when he can come in to have blood work done before that date so it will be available to discuss results with Dr at time of appointment. Ph# [phone]

## 2018-05-01 NOTE — Telephone Encounter (Signed)
PEC-Plz sched pt for a lab within 2 weeks of physical/future orders are placed in the system/thx dmf

## 2018-05-01 NOTE — Telephone Encounter (Signed)
Yes okay to order labs prior to OV.

## 2018-05-09 ENCOUNTER — Other Ambulatory Visit (INDEPENDENT_AMBULATORY_CARE_PROVIDER_SITE_OTHER): Payer: 59

## 2018-05-09 DIAGNOSIS — Z0000 Encounter for general adult medical examination without abnormal findings: Secondary | ICD-10-CM | POA: Diagnosis not present

## 2018-05-09 DIAGNOSIS — E781 Pure hyperglyceridemia: Secondary | ICD-10-CM | POA: Diagnosis not present

## 2018-05-09 DIAGNOSIS — I10 Essential (primary) hypertension: Secondary | ICD-10-CM

## 2018-05-09 LAB — CBC WITH DIFFERENTIAL/PLATELET
Basophils Absolute: 0 10*3/uL (ref 0.0–0.1)
Basophils Relative: 0.8 % (ref 0.0–3.0)
EOS PCT: 2.9 % (ref 0.0–5.0)
Eosinophils Absolute: 0.1 10*3/uL (ref 0.0–0.7)
HCT: 45.7 % (ref 39.0–52.0)
Hemoglobin: 15.9 g/dL (ref 13.0–17.0)
Lymphocytes Relative: 28.1 % (ref 12.0–46.0)
Lymphs Abs: 1.4 10*3/uL (ref 0.7–4.0)
MCHC: 34.8 g/dL (ref 30.0–36.0)
MCV: 84.5 fl (ref 78.0–100.0)
Monocytes Absolute: 0.5 10*3/uL (ref 0.1–1.0)
Monocytes Relative: 9.3 % (ref 3.0–12.0)
Neutro Abs: 3 10*3/uL (ref 1.4–7.7)
Neutrophils Relative %: 58.9 % (ref 43.0–77.0)
Platelets: 216 10*3/uL (ref 150.0–400.0)
RBC: 5.4 Mil/uL (ref 4.22–5.81)
RDW: 12.9 % (ref 11.5–15.5)
WBC: 5.1 10*3/uL (ref 4.0–10.5)

## 2018-05-09 LAB — COMPREHENSIVE METABOLIC PANEL
ALT: 19 U/L (ref 0–53)
AST: 15 U/L (ref 0–37)
Albumin: 4.9 g/dL (ref 3.5–5.2)
Alkaline Phosphatase: 57 U/L (ref 39–117)
BUN: 14 mg/dL (ref 6–23)
CALCIUM: 9.6 mg/dL (ref 8.4–10.5)
CO2: 30 meq/L (ref 19–32)
CREATININE: 1.02 mg/dL (ref 0.40–1.50)
Chloride: 103 mEq/L (ref 96–112)
GFR: 88.26 mL/min (ref >60.00)
Glucose, Bld: 96 mg/dL (ref 70–99)
Potassium: 4.2 mEq/L (ref 3.5–5.1)
Sodium: 141 mEq/L (ref 135–145)
Total Bilirubin: 1.2 mg/dL (ref 0.2–1.2)
Total Protein: 7.1 g/dL (ref 6.0–8.3)

## 2018-05-09 LAB — LIPID PANEL
CHOLESTEROL: 176 mg/dL (ref 0–200)
HDL: 29.6 mg/dL — ABNORMAL LOW (ref >39.00)
NonHDL: 146.22
Total CHOL/HDL Ratio: 6
Triglycerides: 208 mg/dL — ABNORMAL HIGH (ref 0.0–149.0)
VLDL: 41.6 mg/dL — AB (ref 0.0–40.0)

## 2018-05-09 LAB — LDL CHOLESTEROL, DIRECT: LDL DIRECT: 113 mg/dL

## 2018-05-09 LAB — TSH: TSH: 3.67 u[IU]/mL (ref 0.35–4.50)

## 2018-05-14 ENCOUNTER — Encounter: Payer: Self-pay | Admitting: Family Medicine

## 2018-05-14 ENCOUNTER — Ambulatory Visit (INDEPENDENT_AMBULATORY_CARE_PROVIDER_SITE_OTHER): Payer: 59 | Admitting: Family Medicine

## 2018-05-14 VITALS — BP 116/78 | HR 79 | Temp 98.3°F | Ht 71.85 in | Wt 215.2 lb

## 2018-05-14 DIAGNOSIS — Z0000 Encounter for general adult medical examination without abnormal findings: Secondary | ICD-10-CM | POA: Diagnosis not present

## 2018-05-14 NOTE — Progress Notes (Signed)
Subjective:   Patient ID: Derrick Jensen, male    DOB: 1981/02/16, 37 y.o.   MRN: [id number]  Derrick Jensen is a pleasant 37 y.o. year old male who presents to clinic today with Annual Exam (Patient is here today for a CPE.  He declines flu and Tdap and HIV lab draw.  Labs were drawn on 10.10.19 but he is also fasting today just in case.)  on 05/14/2018  HPI:  3 year wedding anniversary next month.  No longer taking antihypertensives and remains normotensive.  Has strong FH of premature CAD.  He has been running 2-3 miles a day 2-3 times a week- denies dizziness, nausea, Cp or SOB with exertion.  Lost 10 pounds with diet.  Cholesterol also controlled without rx.  Lab Results  Component Value Date   CHOL 176 05/09/2018   HDL 29.60 (L) 05/09/2018   LDLCALC 107 (H) 09/16/2014   LDLDIRECT 113.0 05/09/2018   TRIG 208.0 (H) 05/09/2018   CHOLHDL 6 05/09/2018   Lab Results  Component Value Date   NA 141 05/09/2018   K 4.2 05/09/2018   CL 103 05/09/2018   CO2 30 05/09/2018   Lab Results  Component Value Date   ALT 19 05/09/2018   AST 15 05/09/2018   ALKPHOS 57 05/09/2018   BILITOT 1.2 05/09/2018   Lab Results  Component Value Date   CREATININE 1.02 05/09/2018   No current outpatient medications on file prior to visit.   No current facility-administered medications on file prior to visit.     No Known Allergies  Past Medical History:  Diagnosis Date  . No significant past medical history     No past surgical history on file.  Family History  Problem Relation Age of Onset  . Diabetes Father        type 2  . Cancer Father        Stage 4 throat cancer  . Hyperlipidemia Father   . Hypertension Father   . Heart disease Father        MI x2, CAD  . Alzheimer's disease Father     Social History   Socioeconomic History  . Marital status: Married    Spouse name: Not on file  . Number of children: Not on file  . Years of education: 42  . Highest education level:  Not on file  Occupational History    Employer: Probation officer  Social Needs  . Financial resource strain: Not on file  . Food insecurity:    Worry: Not on file    Inability: Not on file  . Transportation needs:    Medical: Not on file    Non-medical: Not on file  Tobacco Use  . Smoking status: Never Smoker  . Smokeless tobacco: Never Used  Substance and Sexual Activity  . Alcohol use: Yes    Comment: 1 drink every 3 weeks  . Drug use: No  . Sexual activity: Yes    Birth control/protection: Condom    Comment: 1 partner  Lifestyle  . Physical activity:    Days per week: Not on file    Minutes per session: Not on file  . Stress: Not on file  Relationships  . Social connections:    Talks on phone: Not on file    Gets together: Not on file    Attends religious service: Not on file    Active member of club or organization: Not on file    Attends meetings of clubs  or organizations: Not on file    Relationship status: Not on file  . Intimate partner violence:    Fear of current or ex partner: Not on file    Emotionally abused: Not on file    Physically abused: Not on file    Forced sexual activity: Not on file  Other Topics Concern  . Not on file  Social History Narrative   06/2011: Lives with girlfriend and a roommate; college graduate-- went to [employer]-- business degree-; employed at [employer].   Originally from [facility].    The PMH, PSH, Social History, Family History, Medications, and allergies have been reviewed in [facility], and have been updated if relevant.   Review of Systems  Constitutional: Negative.   HENT: Negative.   Eyes: Negative.   Respiratory: Negative.   Cardiovascular: Negative.   Gastrointestinal: Negative.   Endocrine: Negative.   Genitourinary: Negative.   Musculoskeletal: Negative.   Skin: Negative.   Allergic/Immunologic: Negative.   Neurological: Negative.   Hematological: Negative.   Psychiatric/Behavioral: Negative.     All other systems reviewed and are negative.      Objective:    BP 116/78 (BP Location: Left Arm, Patient Position: Sitting, Cuff Size: Normal)   Pulse 79   Temp 98.3 F (36.8 C) (Oral)   Ht 5' 11.85" (1.825 m)   Wt 215 lb 3.2 oz (97.6 kg)   SpO2 98%   BMI 29.31 kg/m   Wt Readings from Last 3 Encounters:  05/14/18 215 lb 3.2 oz (97.6 kg)  07/13/17 225 lb 4.8 oz (102.2 kg)  02/13/17 228 lb 3.2 oz (103.5 kg)    Physical Exam  General:  pleasant male in no acute distress Eyes:  PERRL Ears:  External ear exam shows no significant lesions or deformities.  TMs normal bilaterally Hearing is grossly normal bilaterally. Nose:  External nasal examination shows no deformity or inflammation. Nasal mucosa are pink and moist without lesions or exudates. Mouth:  Oral mucosa and oropharynx without lesions or exudates.  Teeth in good repair. Neck:  no carotid bruit or thyromegaly no cervical or supraclavicular lymphadenopathy  Lungs:  Normal respiratory effort, chest expands symmetrically. Lungs are clear to auscultation, no crackles or wheezes. Heart:  Normal rate and regular rhythm. S1 and S2 normal without gallop, murmur, click, rub or other extra sounds. Abdomen:  Bowel sounds positive,abdomen soft and non-tender without masses, organomegaly or hernias noted. Pulses:  R and L posterior tibial pulses are full and equal bilaterally  Extremities:  no edema  Psych:  Good eye contact, not anxious or depressed appearing      Assessment & Plan:   No diagnosis found. No follow-ups on file.

## 2018-05-14 NOTE — Assessment & Plan Note (Signed)
Reviewed preventive care protocols, scheduled due services, and updated immunizations Discussed nutrition, exercise, diet, and healthy lifestyle.  

## 2018-05-14 NOTE — Patient Instructions (Addendum)
Great to see you. Happy Anniversary!

## 2018-08-28 IMAGING — US US ABDOMEN COMPLETE
1 series · 14 of 25 positions shown · non-contrast
Comparison: None

CLINICAL DATA: Abdominal pain for 3 months, elevated LFTs

EXAM:
ABDOMEN ULTRASOUND COMPLETE

[Series 1: us abdomen complete · 0.26mm/px · 14 of 108 slices shown]
[im 1/108]
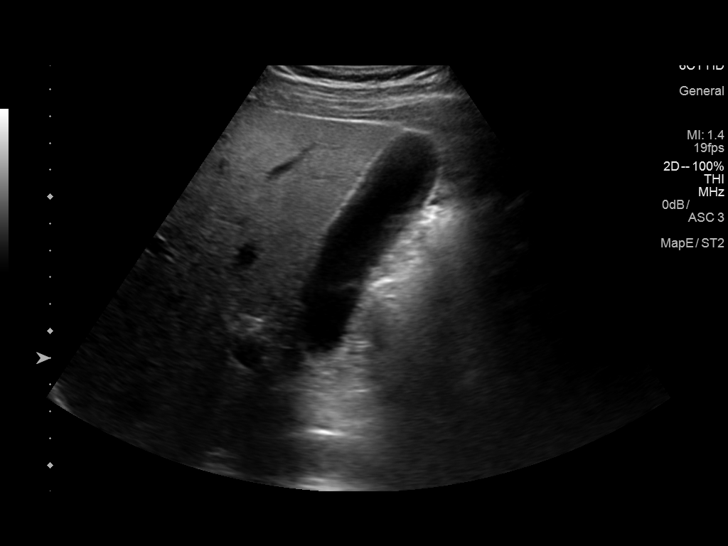
[im 9/108]
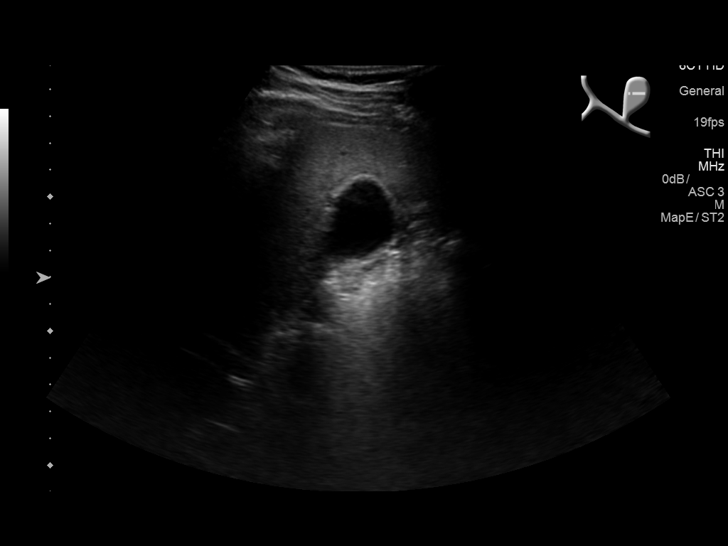
[im 18/108]
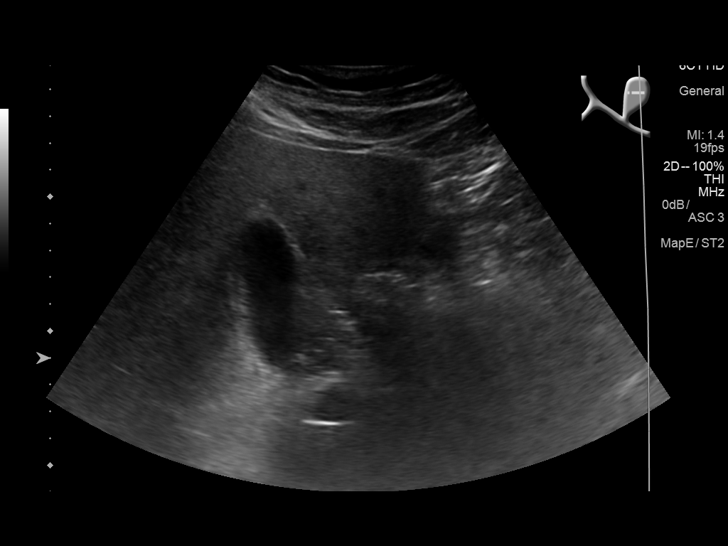
[im 27/108]
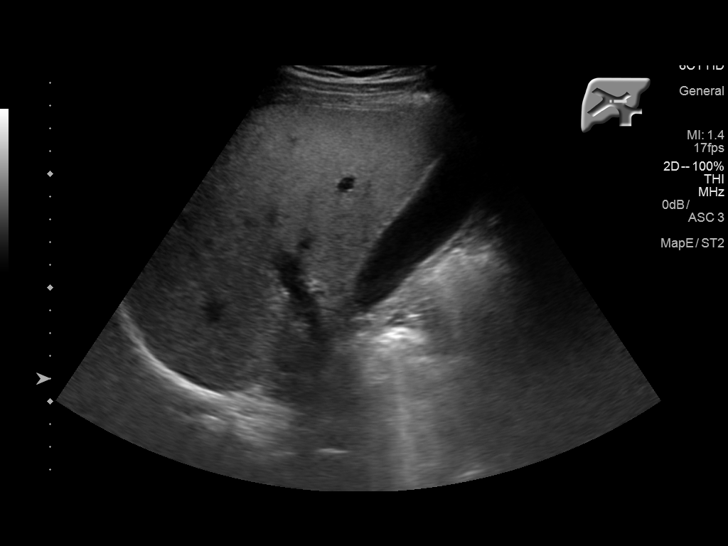
[im 36/108]
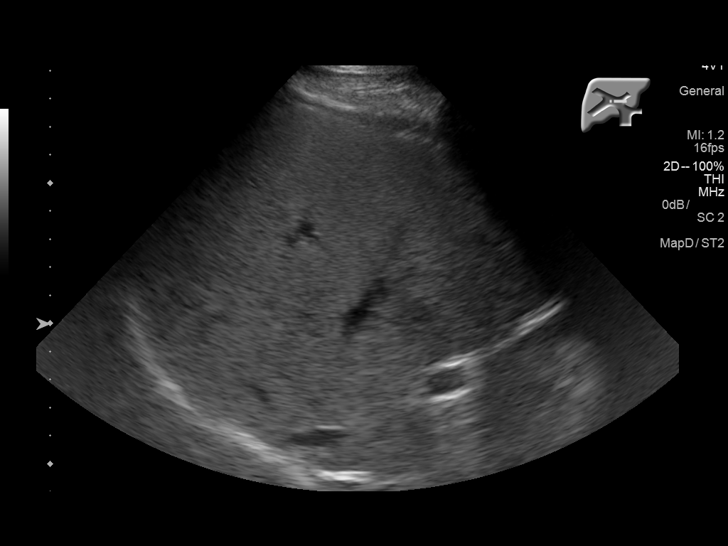
[im 41/108]
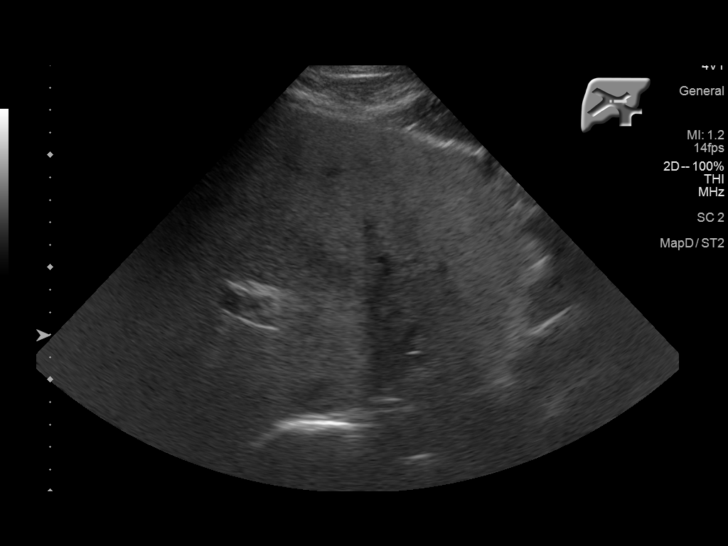
[im 50/108]
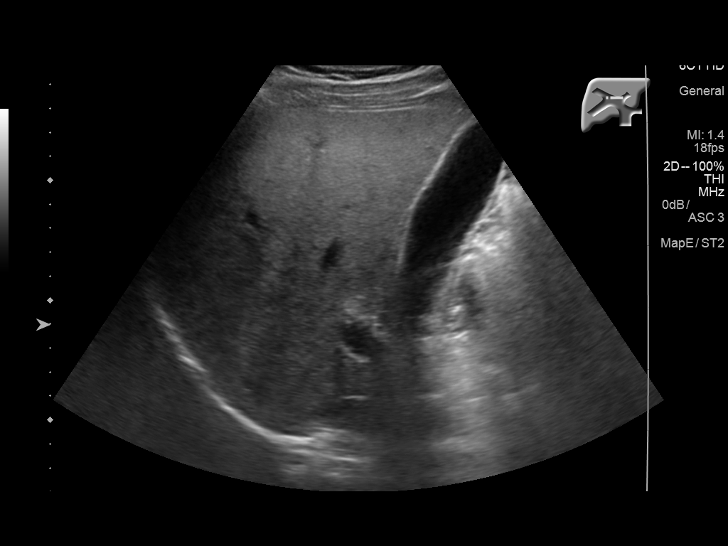
[im 58/108]
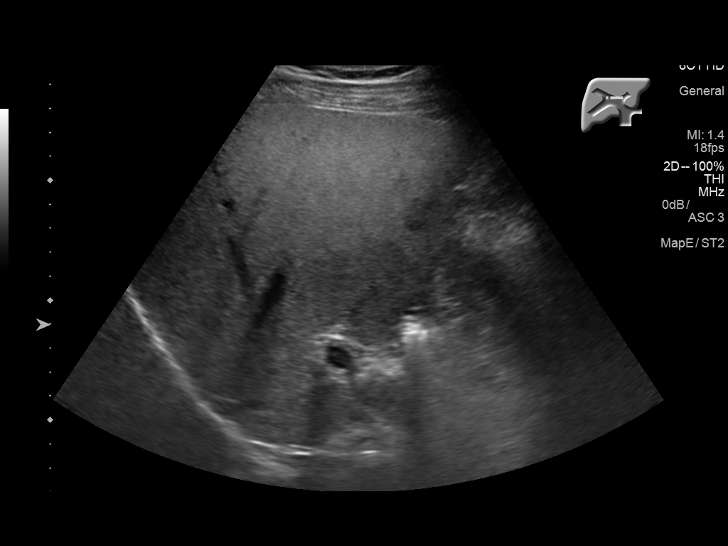
[im 67/108]
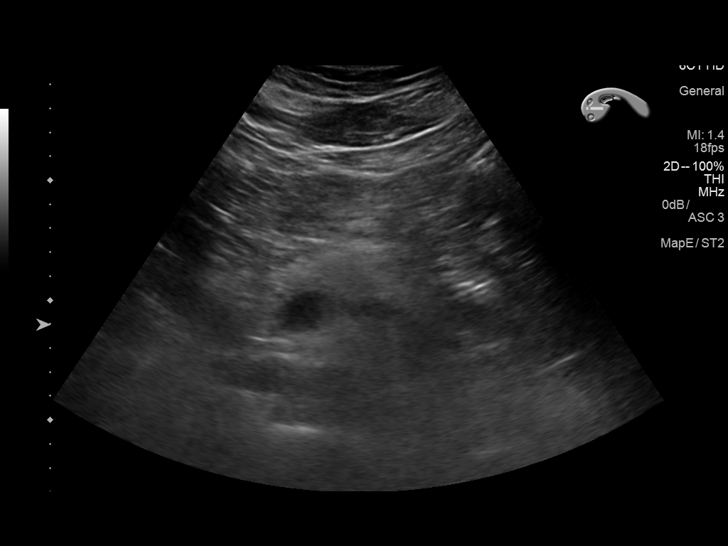
[im 72/108]
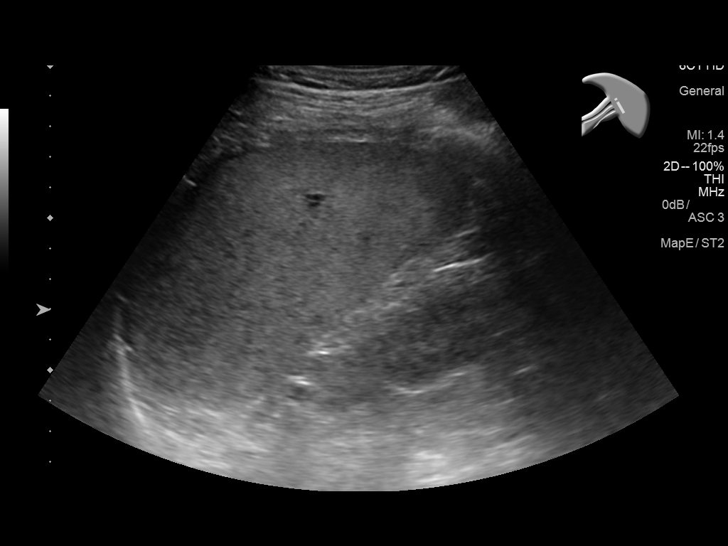
[im 81/108]
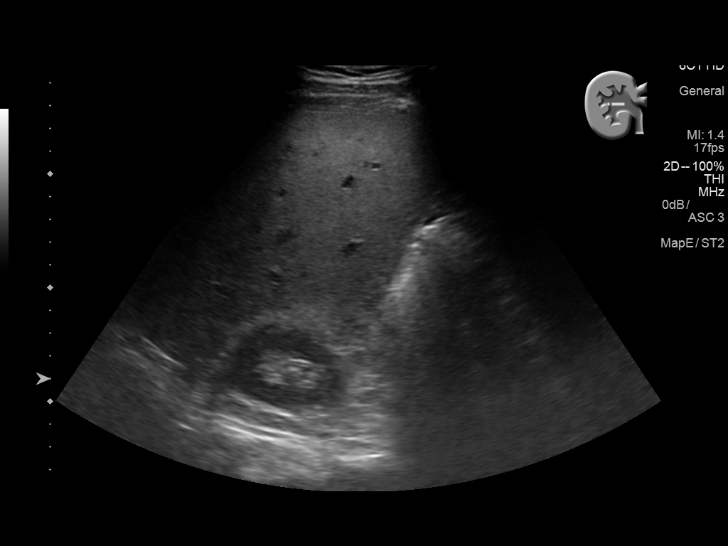
[im 90/108]
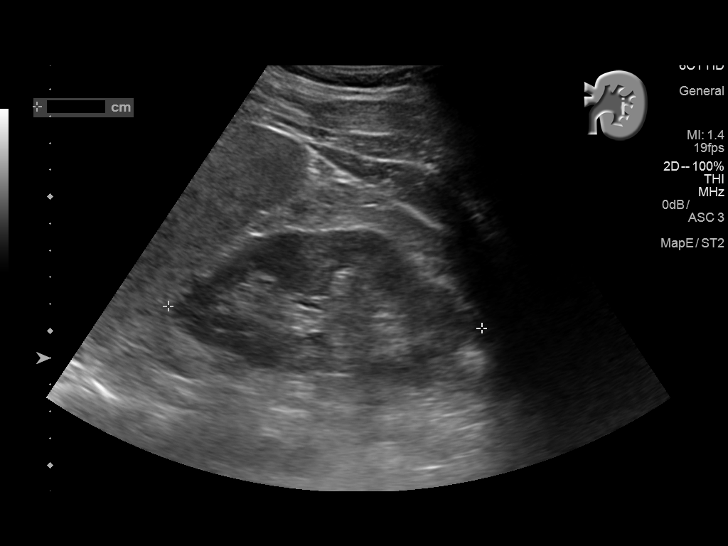
[im 99/108]
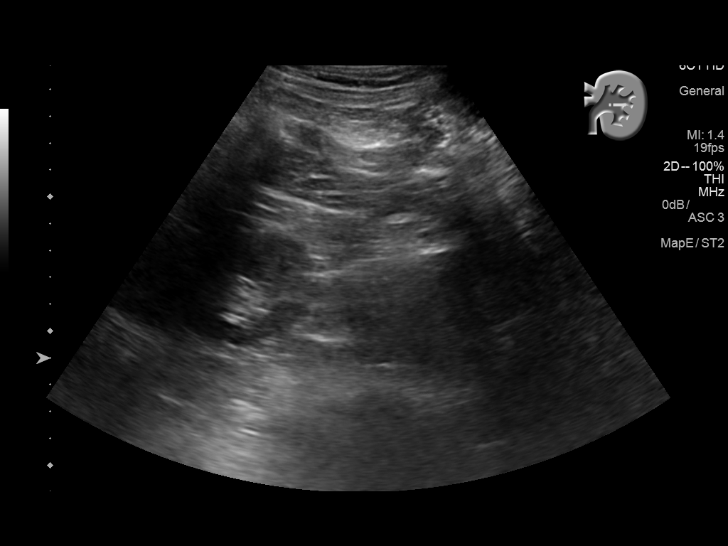
[im 108/108]
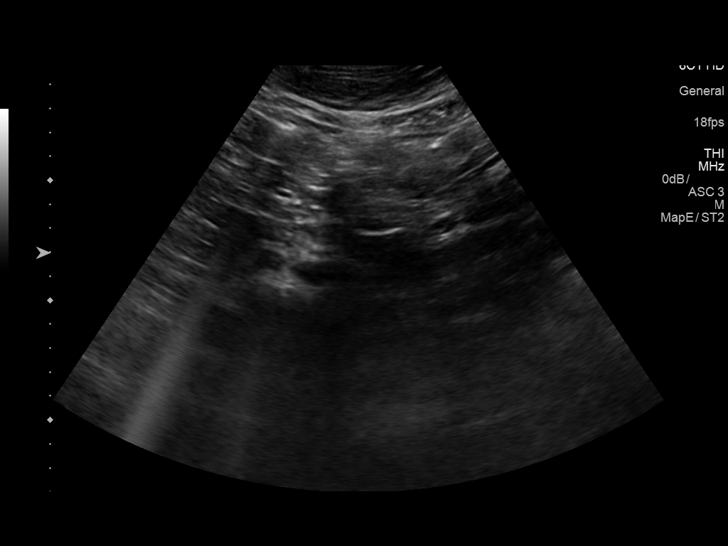

[14 of 25 positions shown; findings below may reference images not displayed]

FINDINGS: Gallbladder: Normally distended without stones or wall thickening.
No pericholecystic fluid or sonographic Murphy sign.

Common bile duct: Diameter: Normal caliber 2 mm diameter

Liver: Echogenic, likely fatty infiltration, though this can be seen
with cirrhosis and certain infiltrative disorders. Suspect
hepatopetal portal venous flow. Mild focal sparing adjacent to porta
hepatis. No discrete hepatic mass.

IVC: Normal appearance

Pancreas: Normal appearance

Spleen: Normal appearance, 11.4 cm length

Right Kidney: Length: 10.8 cm. Normal morphology without mass or
hydronephrosis.

Left Kidney: Length: 11.7 cm. Normal morphology without mass or
hydronephrosis.

Abdominal aorta: Normal caliber

Other findings: No free fluid
IMPRESSION: Probable fatty infiltration of liver with focal sparing adjacent to
porta hepatis as above.

Otherwise negative exam.

## 2019-06-14 NOTE — Progress Notes (Signed)
Subjective:   Patient ID: Derrick Jensen, male    DOB: 1981/07/27, 38 y.o.   MRN: [id number]  Derrick Jensen is a pleasant 38 y.o. year old male who presents to clinic today with Annual Exam (Patient is here today for a CPE.  He is currently fasting for lab work today. Has had  a Tdap & Flu.)  on 06/16/2019  HPI:  Health Maintenance  Topic Date Due  . INFLUENZA VACCINE  06/21/2021 (Originally 03/01/2019)  . TETANUS/TDAP  02/26/2029  . HIV Screening  Discontinued   Has been doing well -working from home and that is going well.  Depression screen [facility] 2/9 06/16/2019 05/14/2018  Decreased Interest 0 0  Down, Depressed, Hopeless 0 0  PHQ - 2 Score 0 0  Altered sleeping 0 -  Tired, decreased energy 0 -  Change in appetite 0 -  Feeling bad or failure about yourself  0 -  Trouble concentrating 0 -  Moving slowly or fidgety/restless 0 -  Suicidal thoughts 0 -  PHQ-9 Score 0 -  Difficult doing work/chores Not difficult at all -   GAD 7 : Generalized Anxiety Score 06/16/2019  Nervous, Anxious, on Edge 0  Control/stop worrying 0  Worry too much - different things 0  Trouble relaxing 0  Restless 0  Easily annoyed or irritable 0  Afraid - awful might happen 0  Total GAD 7 Score 0  Anxiety Difficulty Not difficult at all     No longer taking antihypertensives and remains normotensive.  Has strong FH of premature CAD.  He has been running 2-3 miles a day 2-3 times a week- denies dizziness, nausea, Cp or SOB with exertion. Has lost 30 pounds with diet and exreicse.  The ASCVD Risk score Mikey Bussing DC Jr., et al., 2013) failed to calculate for the following reasons:   The 2013 ASCVD risk score is only valid for ages 38 to 8  Lab Results  Component Value Date   CHOL 176 05/09/2018   HDL 29.60 (L) 05/09/2018   LDLCALC 107 (H) 09/16/2014   LDLDIRECT 113.0 05/09/2018   TRIG 208.0 (H) 05/09/2018   CHOLHDL 6 05/09/2018   Lab Results  Component Value Date   NA 141 05/09/2018   K 4.2  05/09/2018   CL 103 05/09/2018   CO2 30 05/09/2018   Lab Results  Component Value Date   ALT 19 05/09/2018   AST 15 05/09/2018   ALKPHOS 57 05/09/2018   BILITOT 1.2 05/09/2018   Lab Results  Component Value Date   CREATININE 1.02 05/09/2018   No current outpatient medications on file prior to visit.   No current facility-administered medications on file prior to visit.     No Known Allergies  Past Medical History:  Diagnosis Date  . No significant past medical history     History reviewed. No pertinent surgical history.  Family History  Problem Relation Age of Onset  . Diabetes Father        type 2  . Cancer Father        Stage 4 throat cancer  . Hyperlipidemia Father   . Hypertension Father   . Heart disease Father        MI x2, CAD  . Alzheimer's disease Father     Social History   Socioeconomic History  . Marital status: Married    Spouse name: Not on file  . Number of children: Not on file  . Years of education: 65  . Highest  education level: Not on file  Occupational History    Employer: Civil engineer, contracting  Social Needs  . Financial resource strain: Not on file  . Food insecurity    Worry: Not on file    Inability: Not on file  . Transportation needs    Medical: Not on file    Non-medical: Not on file  Tobacco Use  . Smoking status: Never Smoker  . Smokeless tobacco: Never Used  Substance and Sexual Activity  . Alcohol use: Yes    Comment: 1 drink every 3 weeks  . Drug use: No  . Sexual activity: Yes    Birth control/protection: Condom    Comment: 1 partner  Lifestyle  . Physical activity    Days per week: Not on file    Minutes per session: Not on file  . Stress: Not on file  Relationships  . Social Herbalist on phone: Not on file    Gets together: Not on file    Attends religious service: Not on file    Active member of club or organization: Not on file    Attends meetings of clubs or organizations: Not on file     Relationship status: Not on file  . Intimate partner violence    Fear of current or ex partner: Not on file    Emotionally abused: Not on file    Physically abused: Not on file    Forced sexual activity: Not on file  Other Topics Concern  . Not on file  Social History Narrative   06/2011: Lives with girlfriend and a roommate; college graduate-- went to [employer]-- business degree-; employed at [employer].   Originally from [place] [facility] [place].    The PMH, PSH, Social History, Family History, Medications, and allergies have been reviewed in [facility], and have been updated if relevant.   Review of Systems  Constitutional: Negative.   HENT: Negative.   Eyes: Negative.   Respiratory: Negative.   Cardiovascular: Negative.   Gastrointestinal: Negative.   Endocrine: Negative.   Genitourinary: Negative.   Musculoskeletal: Negative.   Skin: Negative.   Allergic/Immunologic: Negative.   Neurological: Negative.   Hematological: Negative.   Psychiatric/Behavioral: Negative.   All other systems reviewed and are negative.      Objective:    Pulse 68   Temp 98.6 F (37 C) (Oral)   Ht 5' 11.75" (1.822 m)   Wt 198 lb 12.8 oz (90.2 kg)   SpO2 99%   BMI 27.15 kg/m   Wt Readings from Last 3 Encounters:  06/16/19 198 lb 12.8 oz (90.2 kg)  05/14/18 215 lb 3.2 oz (97.6 kg)  07/13/17 225 lb 4.8 oz (102.2 kg)    Physical Exam  General:  pleasant male in no acute distress Eyes:  PERRL Ears:  External ear exam shows no significant lesions or deformities.  TMs normal bilaterally Hearing is grossly normal bilaterally. Nose:  External nasal examination shows no deformity or inflammation. Nasal mucosa are pink and moist without lesions or exudates. Mouth:  Oral mucosa and oropharynx without lesions or exudates.  Teeth in good repair. Neck:  no carotid bruit or thyromegaly no cervical or supraclavicular lymphadenopathy  Lungs:  Normal respiratory effort, chest expands symmetrically. Lungs  are clear to auscultation, no crackles or wheezes. Heart:  Normal rate and regular rhythm. S1 and S2 normal without gallop, murmur, click, rub or other extra sounds. Abdomen:  Bowel sounds positive,abdomen soft and non-tender without masses, organomegaly or  hernias noted. Pulses:  R and L posterior tibial pulses are full and equal bilaterally  Extremities:  no edema  Psych:  Good eye contact, not anxious or depressed appearing       Assessment & Plan:   Routine general medical examination at a health care facility - Plan: Comp Met (CMET), Lipid Profile, CBC w/Diff, TSH, Direct LDL  Hypertriglyceridemia - Plan: Comp Met (CMET), Lipid Profile, CBC w/Diff, TSH, Direct LDL  Family history of premature CAD No follow-ups on file.

## 2019-06-16 ENCOUNTER — Encounter: Payer: Self-pay | Admitting: Family Medicine

## 2019-06-16 ENCOUNTER — Ambulatory Visit (INDEPENDENT_AMBULATORY_CARE_PROVIDER_SITE_OTHER): Payer: 59 | Admitting: Family Medicine

## 2019-06-16 ENCOUNTER — Other Ambulatory Visit: Payer: Self-pay

## 2019-06-16 VITALS — HR 68 | Temp 98.6°F | Ht 71.75 in | Wt 198.8 lb

## 2019-06-16 DIAGNOSIS — Z8249 Family history of ischemic heart disease and other diseases of the circulatory system: Secondary | ICD-10-CM

## 2019-06-16 DIAGNOSIS — E781 Pure hyperglyceridemia: Secondary | ICD-10-CM | POA: Diagnosis not present

## 2019-06-16 DIAGNOSIS — Z0000 Encounter for general adult medical examination without abnormal findings: Secondary | ICD-10-CM

## 2019-06-16 LAB — COMPREHENSIVE METABOLIC PANEL
ALT: 17 U/L (ref 0–53)
AST: 18 U/L (ref 0–37)
Albumin: 5 g/dL (ref 3.5–5.2)
Alkaline Phosphatase: 61 U/L (ref 39–117)
BUN: 12 mg/dL (ref 6–23)
CO2: 28 mEq/L (ref 19–32)
Calcium: 9.4 mg/dL (ref 8.4–10.5)
Chloride: 103 mEq/L (ref 96–112)
Creatinine, Ser: 0.97 mg/dL (ref 0.40–1.50)
GFR: 87.45 mL/min (ref >60.00)
Glucose, Bld: 93 mg/dL (ref 70–99)
Potassium: 4.3 mEq/L (ref 3.5–5.1)
Sodium: 139 mEq/L (ref 135–145)
Total Bilirubin: 1.8 mg/dL — ABNORMAL HIGH (ref 0.2–1.2)
Total Protein: 6.9 g/dL (ref 6.0–8.3)

## 2019-06-16 LAB — CBC WITH DIFFERENTIAL/PLATELET
Basophils Absolute: 0 10*3/uL (ref 0.0–0.1)
Basophils Relative: 0.7 % (ref 0.0–3.0)
Eosinophils Absolute: 0.1 10*3/uL (ref 0.0–0.7)
Eosinophils Relative: 2 % (ref 0.0–5.0)
HCT: 44.3 % (ref 39.0–52.0)
Hemoglobin: 15.2 g/dL (ref 13.0–17.0)
Lymphocytes Relative: 30.7 % (ref 12.0–46.0)
Lymphs Abs: 1.3 10*3/uL (ref 0.7–4.0)
MCHC: 34.4 g/dL (ref 30.0–36.0)
MCV: 85.8 fl (ref 78.0–100.0)
Monocytes Absolute: 0.4 10*3/uL (ref 0.1–1.0)
Monocytes Relative: 9.6 % (ref 3.0–12.0)
Neutro Abs: 2.4 10*3/uL (ref 1.4–7.7)
Neutrophils Relative %: 57 % (ref 43.0–77.0)
Platelets: 212 10*3/uL (ref 150.0–400.0)
RBC: 5.16 Mil/uL (ref 4.22–5.81)
RDW: 12.5 % (ref 11.5–15.5)
WBC: 4.2 10*3/uL (ref 4.0–10.5)

## 2019-06-16 LAB — LIPID PANEL
Cholesterol: 165 mg/dL (ref 0–200)
HDL: 37.4 mg/dL — ABNORMAL LOW (ref >39.00)
LDL Cholesterol: 115 mg/dL — ABNORMAL HIGH (ref 0–99)
NonHDL: 127.65
Total CHOL/HDL Ratio: 4
Triglycerides: 62 mg/dL (ref 0.0–149.0)
VLDL: 12.4 mg/dL (ref 0.0–40.0)

## 2019-06-16 LAB — TSH: TSH: 2.21 u[IU]/mL (ref 0.35–4.50)

## 2019-06-16 LAB — LDL CHOLESTEROL, DIRECT: Direct LDL: 118 mg/dL

## 2019-06-16 NOTE — Patient Instructions (Addendum)
Great to see you. I will call you with your lab results from today and you can view them online.    Epidermal Cyst  An epidermal cyst is a sac made of skin tissue. The sac contains a substance called keratin. Keratin is a protein that is normally secreted through the hair follicles. When keratin becomes trapped in the top layer of skin (epidermis), it can form an epidermal cyst. Epidermal cysts can be found anywhere on your body. These cysts are usually harmless (benign), and they may not cause symptoms unless they become infected. What are the causes? This condition may be caused by:  A blocked hair follicle.  A hair that curls and re-enters the skin instead of growing straight out of the skin (ingrown hair).  A blocked pore.  Irritated skin.  An injury to the skin.  Certain conditions that are passed along from parent to child (inherited).  Human papillomavirus (HPV).  Long-term (chronic) sun damage to the skin. What increases the risk? The following factors may make you more likely to develop an epidermal cyst:  Having acne.  Being overweight.  Being 1-36 years old. What are the signs or symptoms? The only symptom of this condition may be a small, painless lump underneath the skin. When an epidermal cyst ruptures, it may become infected. Symptoms may include:  Redness.  Inflammation.  Tenderness.  Warmth.  Fever.  Keratin draining from the cyst. Keratin is grayish-white, bad-smelling substance.  Pus draining from the cyst. How is this diagnosed? This condition is diagnosed with a physical exam.  In some cases, you may have a sample of tissue (biopsy) taken from your cyst to be examined under a microscope or tested for bacteria.  You may be referred to a health care provider who specializes in skin care (dermatologist). How is this treated? In many cases, epidermal cysts go away on their own without treatment. If a cyst becomes infected, treatment may  include:  Opening and draining the cyst, done by a health care provider. After draining, minor surgery to remove the rest of the cyst may be done.  Antibiotic medicine.  Injections of medicines (steroids) that help to reduce inflammation.  Surgery to remove the cyst. Surgery may be done if the cyst: ? Becomes large. ? Bothers you. ? Has a chance of turning into cancer.  Do not try to open a cyst yourself. Follow these instructions at home:  Take over-the-counter and prescription medicines only as told by your health care provider.  If you were prescribed an antibiotic medicine, take it it as told by your health care provider. Do not stop using the antibiotic even if you start to feel better.  Keep the area around your cyst clean and dry.  Wear loose, dry clothing.  Avoid touching your cyst.  Check your cyst every day for signs of infection. Check for: ? Redness, swelling, or pain. ? Fluid or blood. ? Warmth. ? Pus or a bad smell.  Keep all follow-up visits as told by your health care provider. This is important. How is this prevented?  Wear clean, dry, clothing.  Avoid wearing tight clothing.  Keep your skin clean and dry. Take showers or baths every day. Contact a health care provider if:  Your cyst develops symptoms of infection.  Your condition is not improving or is getting worse.  You develop a cyst that looks different from other cysts you have had.  You have a fever. Get help right away if:  Redness  spreads from the cyst into the surrounding area. Summary  An epidermal cyst is a sac made of skin tissue. These cysts are usually harmless (benign), and they may not cause symptoms unless they become infected.  If a cyst becomes infected, treatment may include surgery to open and drain the cyst, or to remove it. Treatment may also include medicines by mouth or through an injection.  Take over-the-counter and prescription medicines only as told by your  health care provider. If you were prescribed an antibiotic medicine, take it as told by your health care provider. Do not stop using the antibiotic even if you start to feel better.  Contact a health care provider if your condition is not improving or is getting worse.  Keep all follow-up visits as told by your health care provider. This is important. This information is not intended to replace advice given to you by your health care provider. Make sure you discuss any questions you have with your health care provider. Document Released: 06/17/2004 Document Revised: 11/07/2018 Document Reviewed: 01/28/2018 Elsevier Patient Education  2020 [employer].

## 2019-06-16 NOTE — Assessment & Plan Note (Signed)
Check labs today.

## 2019-06-16 NOTE — Assessment & Plan Note (Signed)
Reviewed preventive care protocols, scheduled due services, and updated immunizations Discussed nutrition, exercise, diet, and healthy lifestyle.  

## 2020-08-03 ENCOUNTER — Other Ambulatory Visit: Payer: Self-pay

## 2020-08-04 ENCOUNTER — Encounter: Payer: Self-pay | Admitting: Family Medicine

## 2020-08-04 ENCOUNTER — Ambulatory Visit (INDEPENDENT_AMBULATORY_CARE_PROVIDER_SITE_OTHER): Payer: 59 | Admitting: Family Medicine

## 2020-08-04 VITALS — BP 144/86 | HR 74 | Temp 98.2°F | Ht 72.0 in | Wt 205.2 lb

## 2020-08-04 DIAGNOSIS — Z0000 Encounter for general adult medical examination without abnormal findings: Secondary | ICD-10-CM

## 2020-08-04 DIAGNOSIS — Z2821 Immunization not carried out because of patient refusal: Secondary | ICD-10-CM | POA: Diagnosis not present

## 2020-08-04 DIAGNOSIS — E781 Pure hyperglyceridemia: Secondary | ICD-10-CM

## 2020-08-04 DIAGNOSIS — G4733 Obstructive sleep apnea (adult) (pediatric): Secondary | ICD-10-CM | POA: Diagnosis not present

## 2020-08-04 DIAGNOSIS — L309 Dermatitis, unspecified: Secondary | ICD-10-CM

## 2020-08-04 LAB — COMPREHENSIVE METABOLIC PANEL
ALT: 16 U/L (ref 0–53)
AST: 18 U/L (ref 0–37)
Albumin: 5.2 g/dL (ref 3.5–5.2)
Alkaline Phosphatase: 49 U/L (ref 39–117)
BUN: 14 mg/dL (ref 6–23)
CO2: 30 mEq/L (ref 19–32)
Calcium: 9.8 mg/dL (ref 8.4–10.5)
Chloride: 102 mEq/L (ref 96–112)
Creatinine, Ser: 0.98 mg/dL (ref 0.40–1.50)
GFR: 98.59 mL/min (ref >60.00)
Glucose, Bld: 93 mg/dL (ref 70–99)
Potassium: 3.9 mEq/L (ref 3.5–5.1)
Sodium: 138 mEq/L (ref 135–145)
Total Bilirubin: 1.3 mg/dL — ABNORMAL HIGH (ref 0.2–1.2)
Total Protein: 7.4 g/dL (ref 6.0–8.3)

## 2020-08-04 LAB — CBC
HCT: 43.4 % (ref 39.0–52.0)
Hemoglobin: 15.2 g/dL (ref 13.0–17.0)
MCHC: 35 g/dL (ref 30.0–36.0)
MCV: 84.4 fl (ref 78.0–100.0)
Platelets: 203 10*3/uL (ref 150.0–400.0)
RBC: 5.15 Mil/uL (ref 4.22–5.81)
RDW: 12.8 % (ref 11.5–15.5)
WBC: 6 10*3/uL (ref 4.0–10.5)

## 2020-08-04 LAB — LIPID PANEL
Cholesterol: 194 mg/dL (ref 0–200)
HDL: 39.6 mg/dL (ref >39.00)
LDL Cholesterol: 130 mg/dL — ABNORMAL HIGH (ref 0–99)
NonHDL: 154.22
Total CHOL/HDL Ratio: 5
Triglycerides: 123 mg/dL (ref 0.0–149.0)
VLDL: 24.6 mg/dL (ref 0.0–40.0)

## 2020-08-04 MED ORDER — TRIAMCINOLONE ACETONIDE 0.1 % EX CREA: 1.0000 "application " | TOPICAL_CREAM | Freq: Two times a day (BID) | CUTANEOUS | 1 refills | Status: DC

## 2020-08-04 NOTE — Progress Notes (Signed)
Derrick Jensen is a 40 y.o. male  Chief Complaint  Patient presents with  . Establish Care    TOC- CPE/labs.  Also has a rash on his RT shin x months & toenail fungus on the LT foot.   Declines flu shot today.     HPI: Derrick Jensen is a 40 y.o. male who was previously a pt of Dr. Dayton Martes seen today for CPE, labs. He declines flu vaccine today.   Diet/Exercise: running 40 miles per week, lifts weights 3x/wk; diet is average, no soda Dental: overdue Vision: due - last appt 2 years ago  Med refills needed today: n/a  He notes discoloration of Lt great toenail x 18 years, not changed. Not painful or uncomfortable.   He also notes a dry patch of skin that gets irritates at times on his Rt lateral lower leg   Past Medical History:  Diagnosis Date  . No significant past medical history     History reviewed. No pertinent surgical history.  Social History   Socioeconomic History  . Marital status: Married    Spouse name: Not on file  . Number of children: Not on file  . Years of education: 23  . Highest education level: Not on file  Occupational History    Employer: [employer]  Tobacco Use  . Smoking status: Never Smoker  . Smokeless tobacco: Never Used  Vaping Use  . Vaping Use: Never used  Substance and Sexual Activity  . Alcohol use: Yes    Comment: 1 drink every 3 weeks  . Drug use: No  . Sexual activity: Yes    Birth control/protection: Condom    Comment: 1 partner  Other Topics Concern  . Not on file  Social History Narrative   06/2011: Lives with girlfriend and a roommate; college graduate-- went to [employer]-- business degree-; employed at [employer].   Originally from [place].    Social Determinants of Health   Financial Resource Strain: Not on file  Food Insecurity: Not on file  Transportation Needs: Not on file  Physical Activity: Not on file  Stress: Not on file  Social Connections: Not on file  Intimate Partner Violence: Not  on file    Family History  Problem Relation Age of Onset  . Diabetes Father        type 2  . Cancer Father        Stage 4 throat cancer  . Hyperlipidemia Father   . Hypertension Father   . Heart disease Father        MI x2, CAD  . Alzheimer's disease Father      Immunization History  Administered Date(s) Administered  . PFIZER SARS-COV-2 Vaccination 10/16/2019, 11/06/2019    Outpatient Encounter Medications as of 08/04/2020  Medication Sig  . triamcinolone (KENALOG) 0.1 % Apply 1 application topically 2 (two) times daily.   No facility-administered encounter medications on file as of 08/04/2020.     ROS: Gen: no fever, chills  Skin: no rash, itching ENT: no ear pain, ear drainage, nasal congestion, rhinorrhea, sinus pressure, sore throat Eyes: no blurry vision, double vision Resp: no cough, wheeze,SOB CV: no CP, palpitations, LE edema,  GI: no heartburn, n/v/d/c, abd pain GU: no dysuria, urgency, frequency, hematuria; no testicular swelling or masses MSK: no joint pain, myalgias, back pain Neuro: no dizziness, headache, weakness, vertigo Psych: no depression, anxiety, insomnia   No Known Allergies  BP (!) 144/86   Pulse 74  Temp 98.2 F (36.8 C) (Temporal)   Ht 6' (1.829 m)   Wt 205 lb 3.2 oz (93.1 kg)   SpO2 98%   BMI 27.83 kg/m    BP Readings from Last 3 Encounters:  08/04/20 (!) 144/86  05/14/18 116/78  07/13/17 124/86     Physical Exam Constitutional:      General: He is not in acute distress.    Appearance: He is well-developed and well-nourished.  HENT:     Head: Normocephalic and atraumatic.     Right Ear: Tympanic membrane and ear canal normal.     Left Ear: Tympanic membrane and ear canal normal.     Nose: Nose normal.     Mouth/Throat:     Mouth: Oropharynx is clear and moist and mucous membranes are normal.  Neck:     Thyroid: No thyromegaly.  Cardiovascular:     Rate and Rhythm: Normal rate and regular rhythm.     Pulses: Intact  distal pulses.     Heart sounds: Normal heart sounds.  Pulmonary:     Effort: Pulmonary effort is normal.     Breath sounds: Normal breath sounds. No wheezing or rhonchi.  Abdominal:     General: Bowel sounds are normal. There is no distension.     Palpations: Abdomen is soft. There is no mass.     Tenderness: There is no abdominal tenderness. There is no guarding or rebound.  Musculoskeletal:        General: No edema. Normal range of motion.     Cervical back: Neck supple.     Right lower leg: No edema.     Left lower leg: No edema.  Lymphadenopathy:     Cervical: No cervical adenopathy.  Skin:    General: Skin is warm and dry.     Findings: Rash (Rt lateral calf with area of dry, erythematous skin) present.  Neurological:     Mental Status: He is alert and oriented to person, place, and time.     Motor: No abnormal muscle tone.     Coordination: Coordination normal.  Psychiatric:        Mood and Affect: Mood and affect normal.      A/P:   1. Annual physical exam - discussed importance of regular CV exercise, healthy diet, adequate sleep - due for dental and vision exams - declines flu vaccine, otherwise UTD - Comprehensive metabolic panel - CBC - Lipid panel - next CPE in 1 year  2. Hypertriglyceridemia - not on med, pt runs 40 miles per wk - Lipid panel  3. OSA (obstructive sleep apnea) - unable to tolerate CPAP, does not use - snores occasionally but improved since pt started running  4. Influenza vaccination declined by patient  5. Dermatitis - Rt calf Rx: - triamcinolone (KENALOG) 0.1 %; Apply 1 application topically 2 (two) times daily.  Dispense: 30 g; Refill: 1 - discussed importance of regular moisturizing at least daily  This visit occurred during the SARS-CoV-2 public health emergency.  Safety protocols were in place, including screening questions prior to the visit, additional usage of staff PPE, and extensive cleaning of exam room while observing  appropriate contact time as indicated for disinfecting solutions.

## 2021-08-18 NOTE — Progress Notes (Signed)
Subjective:    CC: L knee pain  I, Molly Weber, LAT, ATC, am serving as scribe for Dr. Clementeen Graham.  HPI: Pt is a 41 y/o male presenting w/ L knee pain ongoing since Oct. Pt recalls rolling his L ankle during a 10 mile run. Pt was increasing his running mileage preparing for the [employer]. Pt c/o increased pain during the night. He locates his pain to the anterior aspect of the L knee.  L knee swelling: no Mechanical symptoms: yes- previously Aggravating factors: worse over time on a running, standing for long periods Treatments tried: knee brace,   Pertinent review of Systems: No fevers or chills  Relevant historical information: Sleep apnea.   Objective:    Vitals:   08/19/21 0811  BP: (!) 142/98  Pulse: 80  SpO2: 99%   General: Well Developed, well nourished, and in no acute distress.   MSK: Left knee well-developed musculature otherwise normal-appearing Normal motion. Mildly tender palpation medial patellofemoral joint nontender otherwise. Stable ligamentous exam. Intact strength. Negative Murray's test.  Left hip normal.  Nontender intact hip abduction strength.  Lab and Radiology Results  Diagnostic Limited MSK Ultrasound of: Left knee Quad tendon intact normal-appearing Minimal joint effusion present. Patellar tendon normal-appearing Area of pain at medial patellofemoral joint shows a little bit of hypoechoic fluid but looks normal-appearing otherwise.  No dynamic soft tissue impingement on knee range of motion. Medial and lateral joint line normal. Posterior knee no Baker's cyst. Impression: Largely normal-appearing knee MSK ultrasound.   X-ray images left knee obtained today personally and independently interpreted No acute fractures.  Normal-appearing x-ray otherwise.  No significant DJD. Await formal radiology review  Impression and Recommendations:    Assessment and Plan: 41 y.o. male with left knee pain.  Pain predominantly located  at medial patellofemoral joint line with running and dynamic exercise.  I cannot feel a plica nor see soft tissue impingement in this area on ultrasound.  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  Plan for trial of PT and Voltaren gel and check back in about 6 weeks.  If not improved neck step should be MRI to further evaluate source of pain.  Could consider direct injection at area of pain at medial patellofemoral joint as well.  PDMP not reviewed this encounter. Orders Placed This Encounter  Procedures   [place] LIMITED JOINT SPACE STRUCTURES LOW LEFT(NO LINKED CHARGES)    Standing Status:   Future    Number of Occurrences:   1    Standing Expiration Date:   02/16/2022    Order Specific Question:   Reason for Exam (SYMPTOM  OR DIAGNOSIS REQUIRED)    Answer:   left knee pain    Order Specific Question:   Preferred imaging location?    Answer:   [facility] Knee AP/LAT W/Sun[place] Left    Standing Status:   Future    Number of Occurrences:   1    Standing Expiration Date:   08/19/2022    Order Specific Question:   Reason for Exam (SYMPTOM  OR DIAGNOSIS REQUIRED)    Answer:   left knee pain    Order Specific Question:   Preferred imaging location?    Answer:   [facility]   Ambulatory referral to Physical Therapy    Referral Priority:   Routine    Referral Type:   Physical Medicine    Referral Reason:   Specialty  Services Required    Requested Specialty:   Physical Therapy    Number of Visits Requested:   1   No orders of the defined types were placed in this encounter.   Discussed warning signs or symptoms. Please see discharge instructions. Patient expresses understanding.   The above documentation has been reviewed and is accurate and complete Clementeen Graham, M.D.

## 2021-08-19 ENCOUNTER — Ambulatory Visit (INDEPENDENT_AMBULATORY_CARE_PROVIDER_SITE_OTHER): Payer: 59 | Admitting: Family Medicine

## 2021-08-19 ENCOUNTER — Ambulatory Visit (INDEPENDENT_AMBULATORY_CARE_PROVIDER_SITE_OTHER): Payer: 59

## 2021-08-19 ENCOUNTER — Ambulatory Visit: Payer: Self-pay

## 2021-08-19 ENCOUNTER — Other Ambulatory Visit: Payer: Self-pay

## 2021-08-19 VITALS — BP 142/98 | HR 80 | Ht 72.0 in | Wt 208.4 lb

## 2021-08-19 DIAGNOSIS — M25562 Pain in left knee: Secondary | ICD-10-CM

## 2021-08-19 DIAGNOSIS — G8929 Other chronic pain: Secondary | ICD-10-CM

## 2021-08-19 NOTE — Patient Instructions (Addendum)
Thank you for coming in today.   Please get an Xray today before you leave   Please use Voltaren gel (Generic Diclofenac Gel) up to 4x daily for pain as needed.  This is available over-the-counter as both the name brand Voltaren gel and the generic diclofenac gel.   I've referred you to Physical Therapy.  Let us know if you don't hear from them in one week.   Recheck in 6 weeks

## 2021-08-22 NOTE — Progress Notes (Signed)
Left knee x-ray looks normal to radiology

## 2021-09-05 ENCOUNTER — Other Ambulatory Visit: Payer: Self-pay

## 2021-09-05 ENCOUNTER — Encounter: Payer: Self-pay | Admitting: Physical Therapy

## 2021-09-05 ENCOUNTER — Ambulatory Visit: Payer: 59 | Attending: Family Medicine | Admitting: Physical Therapy

## 2021-09-05 DIAGNOSIS — M25562 Pain in left knee: Secondary | ICD-10-CM | POA: Diagnosis not present

## 2021-09-05 DIAGNOSIS — G8929 Other chronic pain: Secondary | ICD-10-CM | POA: Insufficient documentation

## 2021-09-05 NOTE — Therapy (Signed)
[facility] [address] [place], [place], [address] Phone: [phone]   Fax:  [phone]  Physical Therapy Evaluation  Patient Details  Name: Derrick Jensen MRN: [id number] Date of Birth: 13-Mar-1981 Referring Provider (PT): Cheron Every Date: 09/05/2021   PT End of Session - 09/05/21 1750     Visit Number 1    Date for PT Re-Evaluation 12/03/21    Authorization Type [employer], does not approve ionto    PT Start Time 1705    PT Stop Time 1751    PT Time Calculation (min) 46 min    Activity Tolerance Patient tolerated treatment well    Behavior During Therapy [facility] for tasks assessed/performed             Past Medical History:  Diagnosis Date   No significant past medical history     History reviewed. No pertinent surgical history.  There were no vitals filed for this visit.    Subjective Assessment - 09/05/21 1709     Subjective Patient reports that he is a runner, was feeling some left knee anterior pain after a 12 mile run, he reports then an 18 mile run really cuased the pain, he reports that also lifts weights, he was doing 6 days of running and then 4 days weights.  Musculoskeletal US revealed no issues.  He c/o pain mostly at night, does feel it when he runs    Patient Stated Goals have no pain    Currently in Pain? No/denies    Pain Score 3     Pain Location Knee    Pain Orientation Left;Anterior;Medial    Pain Descriptors / Indicators Aching;Sharp    Pain Type Acute pain    Pain Onset More than a month ago    Pain Frequency Intermittent    Aggravating Factors  running and at night pain up to 7/10    Pain Relieving Factors rest, walking no pain    Effect of Pain on Daily Activities jsut pain at night, some pain with running                [facility] PT Assessment - 09/05/21 0001       Assessment   Medical Diagnosis left knee pain    Referring Provider (PT) Denyse Amass    Onset Date/Surgical Date  08/05/21    Prior Therapy no      Precautions   Precautions None      Balance Screen   Has the patient fallen in the past 6 months No    Has the patient had a decrease in activity level because of a fear of falling?  No    Is the patient reluctant to leave their home because of a fear of falling?  No      Home Environment   Additional Comments no stairs      Prior Function   Level of Independence Independent    Vocation Full time employment    Vocation Requirements mostly sitting      ROM / Strength   AROM / PROM / Strength Strength;AROM      AROM   Overall AROM Comments WNL's      Strength   Overall Strength Comments WNL's but some pain with MMT for extension      Flexibility   Soft Tissue Assessment /Muscle Length yes    Hamstrings very tight    ITB very tight    Piriformis tight  Palpation   Palpation comment mild lateral tracking patella, mild tenderness in the medial patella      Ambulation/Gait   Gait Comments WNL's                        Objective measurements completed on examination: See above findings.       [facility] Adult PT Treatment/Exercise - 09/05/21 0001       Modalities   Modalities Iontophoresis      Iontophoresis   Type of Iontophoresis Dexamethasone    Location left medial patella    Dose 24mA    Time 4 hour patch                       PT Short Term Goals - 09/05/21 1754       PT SHORT TERM GOAL #1   Title independent with initial HEP    Time 2    Period Weeks    Status Achieved               PT Long Term Goals - 09/05/21 1755       PT LONG TERM GOAL #1   Title jog 5 miles without pain    Time 12    Period Weeks    Status New      PT LONG TERM GOAL #2   Title increase HS flexibility to 70 degrees  SLR    Baseline at eval 45 degrees    Time 12    Period Weeks    Status New                    Plan - 09/05/21 1751     Clinical Impression Statement Patient is an avid  runner, he completed 48.6 miles of running over a 4 day period over a month ago, he has left medial patellar pain, his pain is only with running and hsa pain at night as well, his ROM is WFL's but he is very tight especially, HS, calf and ITB, has slight lateral tracking patella, seems to have a little bit of a weak VMO.  He reports that he started having pain after he stopped lifting weight, he reports that he is resuming that and feels a little better.    Stability/Clinical Decision Making Stable/Uncomplicated    Clinical Decision Making Low    Rehab Potential Good    PT Frequency 1x / week    PT Duration 12 weeks    PT Treatment/Interventions ADLs/Self Care Home Management;Patient/family education;Therapeutic activities;Therapeutic exercise;Balance training;Neuromuscular re-education;Gait training;Manual techniques    PT Next Visit Plan Patient may not return, he is very dedicated to doing the HEP that I gave and trying to get back to working out the quads.  If he does return needs to focus on stretching    Consulted and Agree with Plan of Care Patient             Patient will benefit from skilled therapeutic intervention in order to improve the following deficits and impairments:  Decreased range of motion, Difficulty walking, Pain, Improper body mechanics, Impaired flexibility  Visit Diagnosis: Acute pain of left knee - Plan: PT plan of care cert/re-cert     Problem List Patient Active Problem List   Diagnosis Date Noted   OSA (obstructive sleep apnea) 07/13/2017   Snoring 09/28/2016   Routine general medical examination at a health care facility 03/11/2014   Hypertriglyceridemia 01/16/2013  Family history of premature CAD 01/02/2013    Jearld Lesch, PT 09/05/2021, 5:57 PM  [facility] [address] [place], [place], [address] Phone: [phone]   Fax:  [phone]  Name: Derrick Jensen MRN: [id number] Date of  Birth: 1980/12/07

## 2021-09-05 NOTE — Patient Instructions (Signed)
Access Code: [email or web address] URL: [email or web address] Date: 09/05/2021 Prepared by: Stacie Glaze  Exercises Supine Hamstring Stretch with Doorway - 2 x daily - 7 x weekly - 3 sets - 5 reps - 30 hold Seated Hamstring Stretch with Chair - 2 x daily - 7 x weekly - 3 sets - 5 reps - 30 hold Supine ITB Stretch with Strap - 2 x daily - 7 x weekly - 3 sets - 5 reps - 30 hold Standing Gastroc Stretch on Foam 1/2 Roll - 2 x daily - 7 x weekly - 3 sets - 5 reps - 30 hold

## 2021-09-29 NOTE — Progress Notes (Signed)
? ?  I, Derrick Jensen, LAT, ATC, am serving as scribe for Dr. Lynne Leader. ? ?Derrick Jensen is a 41 y.o. male who presents to [place] [facility] today for f/u of L knee pain.  He was last seen by Dr. Georgina Snell on 08/19/21 and noted L ant knee pain that began after rolling his L ankle while running 10 miles in preparation for the [place] in [place], [place].  He was referred to PT and has completed 1 visit.  He was also advised to use Voltaren gel.  Today, pt reports L knee is improvement, but the pain remains there somewhat. He is currently not training or planning for any upcoming races. ? ?He had a physical therapy session where he had iontophoresis which he thinks has helped some. ? ?Diagnostic testing: L knee XR- 08/19/21 ? ?Pertinent review of systems: No fevers or chills ? ?Relevant historical information: Sleep apnea. ? ? ?Exam:  ?BP 98/70   Pulse 70   Ht 6' (1.829 m)   Wt 213 lb 9.6 oz (96.9 kg)   SpO2 98%   BMI 28.97 kg/m?  ?General: Well Developed, well nourished, and in no acute distress.  ? ?MSK: Left knee: Normal-appearing ?Nontender. ?Normal motion. ?Intact strength. ? ? ? ?Assessment and Plan: ?41 y.o. male with left knee pain at medial knee.  Improved with time PT home exercise program Voltaren gel and 1 episode of iontophoresis.  Plan to continue home exercise program and check back as needed.  Certainly could proceed with more PT or even MRI in the future if needed. ? ? ? ?Discussed warning signs or symptoms. Please see discharge instructions. Patient expresses understanding. ? ? ?The above documentation has been reviewed and is accurate and complete Lynne Leader, M.D. ? ? ?Total encounter time 20 minutes including face-to-face time with the patient and, reviewing past medical record, and charting on the date of service.   ?Plan and options ? ?

## 2021-09-30 ENCOUNTER — Ambulatory Visit (INDEPENDENT_AMBULATORY_CARE_PROVIDER_SITE_OTHER): Payer: 59 | Admitting: Family Medicine

## 2021-09-30 ENCOUNTER — Other Ambulatory Visit: Payer: Self-pay

## 2021-09-30 VITALS — BP 98/70 | HR 70 | Ht 72.0 in | Wt 213.6 lb

## 2021-09-30 DIAGNOSIS — M25562 Pain in left knee: Secondary | ICD-10-CM | POA: Diagnosis not present

## 2021-09-30 DIAGNOSIS — G8929 Other chronic pain: Secondary | ICD-10-CM

## 2021-09-30 NOTE — Patient Instructions (Addendum)
Thank you for coming in today.  ? ?Let me know if you aren't getting better and we can get a MRI ? ?Here is a list of primary care options ?

## 2021-12-09 ENCOUNTER — Encounter: Payer: 59 | Admitting: Physician Assistant

## 2022-01-06 ENCOUNTER — Ambulatory Visit (INDEPENDENT_AMBULATORY_CARE_PROVIDER_SITE_OTHER): Payer: 59 | Admitting: Physician Assistant

## 2022-01-06 ENCOUNTER — Encounter: Payer: Self-pay | Admitting: Physician Assistant

## 2022-01-06 VITALS — BP 130/90 | HR 77 | Temp 97.7°F | Ht 72.0 in | Wt 205.0 lb

## 2022-01-06 DIAGNOSIS — E663 Overweight: Secondary | ICD-10-CM

## 2022-01-06 DIAGNOSIS — Z0000 Encounter for general adult medical examination without abnormal findings: Secondary | ICD-10-CM | POA: Diagnosis not present

## 2022-01-06 DIAGNOSIS — E781 Pure hyperglyceridemia: Secondary | ICD-10-CM | POA: Diagnosis not present

## 2022-01-06 LAB — CBC WITH DIFFERENTIAL/PLATELET
Basophils Absolute: 0 10*3/uL (ref 0.0–0.1)
Basophils Relative: 0.3 % (ref 0.0–3.0)
Eosinophils Absolute: 0.1 10*3/uL (ref 0.0–0.7)
Eosinophils Relative: 1.5 % (ref 0.0–5.0)
HCT: 43.6 % (ref 39.0–52.0)
Hemoglobin: 15.1 g/dL (ref 13.0–17.0)
Lymphocytes Relative: 16.5 % (ref 12.0–46.0)
Lymphs Abs: 1.3 10*3/uL (ref 0.7–4.0)
MCHC: 34.7 g/dL (ref 30.0–36.0)
MCV: 83.6 fl (ref 78.0–100.0)
Monocytes Absolute: 0.5 10*3/uL (ref 0.1–1.0)
Monocytes Relative: 7 % (ref 3.0–12.0)
Neutro Abs: 5.7 10*3/uL (ref 1.4–7.7)
Neutrophils Relative %: 74.7 % (ref 43.0–77.0)
Platelets: 235 10*3/uL (ref 150.0–400.0)
RBC: 5.22 Mil/uL (ref 4.22–5.81)
RDW: 12.9 % (ref 11.5–15.5)
WBC: 7.7 10*3/uL (ref 4.0–10.5)

## 2022-01-06 LAB — LIPID PANEL
Cholesterol: 171 mg/dL (ref 0–200)
HDL: 35.1 mg/dL — ABNORMAL LOW (ref >39.00)
LDL Cholesterol: 113 mg/dL — ABNORMAL HIGH (ref 0–99)
NonHDL: 136.32
Total CHOL/HDL Ratio: 5
Triglycerides: 116 mg/dL (ref 0.0–149.0)
VLDL: 23.2 mg/dL (ref 0.0–40.0)

## 2022-01-06 LAB — COMPREHENSIVE METABOLIC PANEL
ALT: 22 U/L (ref 0–53)
AST: 15 U/L (ref 0–37)
Albumin: 4.7 g/dL (ref 3.5–5.2)
Alkaline Phosphatase: 52 U/L (ref 39–117)
BUN: 13 mg/dL (ref 6–23)
CO2: 29 mEq/L (ref 19–32)
Calcium: 9.5 mg/dL (ref 8.4–10.5)
Chloride: 103 mEq/L (ref 96–112)
Creatinine, Ser: 1 mg/dL (ref 0.40–1.50)
GFR: 95.27 mL/min (ref >60.00)
Glucose, Bld: 98 mg/dL (ref 70–99)
Potassium: 4.3 mEq/L (ref 3.5–5.1)
Sodium: 138 mEq/L (ref 135–145)
Total Bilirubin: 0.9 mg/dL (ref 0.2–1.2)
Total Protein: 7 g/dL (ref 6.0–8.3)

## 2022-01-06 NOTE — Progress Notes (Signed)
Subjective:    Derrick Jensen is a 41 y.o. male and is here for a comprehensive physical exam.  HPI  Health Maintenance Due  Topic Date Due   Hepatitis C Screening  Never done    Acute Concerns: None  Chronic Issues: Hypertriglyceridemia -- hx of this being elevated in the past. Not on meds. Does have strong family hx of premature CAD -- will try for Calcium Score at age 44.  Health Maintenance: Immunizations -- UTD Colonoscopy -- n/a PSA -- No results found for: "PSA1", "PSA" Diet -- drinks mostly water, need to work on healthy diet Sleep habits -- no major concerns Exercise -- runner Weight -- Weight: 205 lb (93 kg)  Weight history Wt Readings from Last 10 Encounters:  01/06/22 205 lb (93 kg)  09/30/21 213 lb 9.6 oz (96.9 kg)  08/19/21 208 lb 6.4 oz (94.5 kg)  08/04/20 205 lb 3.2 oz (93.1 kg)  06/16/19 198 lb 12.8 oz (90.2 kg)  05/14/18 215 lb 3.2 oz (97.6 kg)  07/13/17 225 lb 4.8 oz (102.2 kg)  02/13/17 228 lb 3.2 oz (103.5 kg)  11/06/16 232 lb (105.2 kg)  09/28/16 226 lb 12 oz (102.9 kg)   Body mass index is 27.8 kg/m. Mood -- overall normal Tobacco use --  Tobacco Use: Low Risk  (01/06/2022)   Patient History    Smoking Tobacco Use: Never    Smokeless Tobacco Use: Never    Passive Exposure: Not on file    Alcohol use ---  reports current alcohol use.      01/06/2022    8:01 AM  Depression screen PHQ 2/9  Decreased Interest 0  Down, Depressed, Hopeless 0  PHQ - 2 Score 0   Not UTD with dentist and eye doctor  Other providers/specialists: Patient Care Team: Jarold Motto, [place] as PCP - General (Physician Assistant)   PMHx, SurgHx, SocialHx, Medications, and Allergies were reviewed in the Visit Navigator and updated as appropriate.   Past Medical History:  Diagnosis Date   No significant past medical history     History reviewed. No pertinent surgical history.   Family History  Problem Relation Age of Onset   Diabetes Father        type 2    Cancer Father        Stage 4 throat cancer   Hyperlipidemia Father    Hypertension Father    Heart disease Father        MI x 4, CAD; first at age 90   Alzheimer's disease Father    Heart attack Paternal Grandfather    Prostate cancer Neg Hx    Colon cancer Neg Hx     Social History   Tobacco Use   Smoking status: Never   Smokeless tobacco: Never  Vaping Use   Vaping Use: Never used  Substance Use Topics   Alcohol use: Yes    Comment: 1 drink every 3 weeks   Drug use: No    Review of Systems:   Review of Systems  Constitutional:  Negative for chills, fever, malaise/fatigue and weight loss.  HENT:  Negative for hearing loss, sinus pain and sore throat.   Respiratory:  Negative for cough and hemoptysis.   Cardiovascular:  Negative for chest pain, palpitations, leg swelling and PND.  Gastrointestinal:  Negative for abdominal pain, constipation, diarrhea, heartburn, nausea and vomiting.  Genitourinary:  Negative for dysuria, frequency and urgency.  Musculoskeletal:  Negative for back pain, myalgias and neck pain.  Skin:  Negative for itching and rash.  Neurological:  Negative for dizziness, tingling, seizures and headaches.  Endo/Heme/Allergies:  Negative for polydipsia.  Psychiatric/Behavioral:  Negative for depression. The patient is not nervous/anxious.     Objective:   Vitals:   01/06/22 0802  BP: 126/90  Pulse: 70  Temp: 97.7 F (36.5 C)  SpO2: 98%   Body mass index is 27.8 kg/m.  General Appearance:  Alert, cooperative, no distress, appears stated age  Head:  Normocephalic, without obvious abnormality, atraumatic  Eyes:  PERRL, conjunctiva/corneas clear, EOM's intact, fundi benign, both eyes       Ears:  Normal TM's and external ear canals, both ears  Nose: Nares normal, septum midline, mucosa normal, no drainage    or sinus tenderness  Throat: Lips, mucosa, and tongue normal; teeth and gums normal  Neck: Supple, symmetrical, trachea midline, no  adenopathy; thyroid:  No enlargement/tenderness/nodules; no carotit bruit or JVD  Back:   Symmetric, no curvature, ROM normal, no CVA tenderness  Lungs:   Clear to auscultation bilaterally, respirations unlabored  Chest wall:  No tenderness or deformity  Heart:  Regular rate and rhythm, S1 and S2 normal, no murmur, rub   or gallop  Abdomen:   Soft, non-tender, bowel sounds active all four quadrants, no masses, no organomegaly  Extremities: Extremities normal, atraumatic, no cyanosis or edema  Prostate: Not done.   Skin: Skin color, texture, turgor normal, no rashes or lesions  Lymph nodes: Cervical, supraclavicular, and axillary nodes normal  Neurologic: CNII-XII grossly intact. Normal strength, sensation and reflexes throughout    Assessment/Plan:   Routine physical examination Today patient counseled on age appropriate routine health concerns for screening and prevention, each reviewed and up to date or declined. Immunizations reviewed and up to date or declined. Labs ordered and reviewed. Risk factors for depression reviewed and negative. Hearing function and visual acuity are intact. ADLs screened and addressed as needed. Functional ability and level of safety reviewed and appropriate. Education, counseling and referrals performed based on assessed risks today. Patient provided with a copy of personalized plan for preventive services.  Overweight Continue diet and exercise as able  Hypertriglyceridemia Update blood work Calcium score at 40 due to family heart history  Patient Counseling: [x]   Nutrition: Stressed importance of moderation in sodium/caffeine intake, saturated fat and cholesterol, caloric balance, sufficient intake of fresh fruits, vegetables, and fiber.  [x]   Stressed the importance of regular exercise.   []   Substance Abuse: Discussed cessation/primary prevention of tobacco, alcohol, or other drug use; driving or other dangerous activities under the influence;  availability of treatment for abuse.   [x]   Injury prevention: Discussed safety belts, safety helmets, smoke detector, smoking near bedding or upholstery.   []   Sexuality: Discussed sexually transmitted diseases, partner selection, use of condoms, avoidance of unintended pregnancy  and contraceptive alternatives.   [x]   Dental health: Discussed importance of regular tooth brushing, flossing, and dental visits.  [x]   Health maintenance and immunizations reviewed. Please refer to Health maintenance section.    , PA-C [facility]

## 2022-01-06 NOTE — Patient Instructions (Signed)
It was great to see you!  Please work on becoming up to date with the dentist and eye doctor.  Please go to the lab for blood work.   Our office will call you with your results unless you have chosen to receive results via MyChart.  If your blood work is normal we will follow-up each year for physicals and as scheduled for chronic medical problems.  If anything is abnormal we will treat accordingly and get you in for a follow-up.  Take care,  Lelon Mast

## 2022-04-24 ENCOUNTER — Encounter: Payer: Self-pay | Admitting: *Deleted

## 2022-07-13 ENCOUNTER — Encounter: Payer: Self-pay | Admitting: *Deleted

## 2023-01-10 NOTE — Progress Notes (Signed)
Subjective:    Derrick Jensen is a 42 y.o. male and is here for a comprehensive physical exam.  HPI  Health Maintenance Due  Topic Date Due   Hepatitis C Screening  Never done    Acute Concerns: None  Chronic Issues: Hyperlipidemia LDL elevated at 113. HDL low at 35.1. Not currently taking meds.  Family history of premature CAD. Agreeable to getting coronary calcium score.  Dad recently diagnosed with prostate cancer.  CMET, CBC normal. Does not see a dermatologist- denies new or concerning skin growths.  Does not wear sunscreen frequently but wears a hat when outside. No family history of melanomas. Denies unusual headaches, ankle swelling, tingling in extremities, GI symptoms.  Health Maintenance: Immunizations -- UTD on tetanus vaccine. Colonoscopy -- N/A PSA -- No results found for: "PSA1", "PSA" Diet -- Eats healthy. Sleep habits -- Stable. Exercise -- Runs frequently.  Weight -- @FLOWAMB (14)@  Recent weight history Wt Readings from Last 10 Encounters:  01/17/23 208 lb (94.3 kg)  01/06/22 205 lb (93 kg)  09/30/21 213 lb 9.6 oz (96.9 kg)  08/19/21 208 lb 6.4 oz (94.5 kg)  08/04/20 205 lb 3.2 oz (93.1 kg)  06/16/19 198 lb 12.8 oz (90.2 kg)  05/14/18 215 lb 3.2 oz (97.6 kg)  07/13/17 225 lb 4.8 oz (102.2 kg)  02/13/17 228 lb 3.2 oz (103.5 kg)  11/06/16 232 lb (105.2 kg)   Body mass index is 28.21 kg/m.  Mood -- Stable Alcohol use --  reports current alcohol use.  Tobacco use --  Tobacco Use: Low Risk  (01/17/2023)   Patient History    Smoking Tobacco Use: Never    Smokeless Tobacco Use: Never    Passive Exposure: Not on file    Eligible for Low Dose CT? No  UTD with eye doctor? UTD UTD with dentist? Not UTD     01/17/2023    8:11 AM  Depression screen PHQ 2/9  Decreased Interest 0  Down, Depressed, Hopeless 0  PHQ - 2 Score 0    Other providers/specialists: Patient Care Team: Jarold Motto, [place] as PCP - General (Physician Assistant)     PMHx, SurgHx, SocialHx, Medications, and Allergies were reviewed in the Visit Navigator and updated as appropriate.   Past Medical History:  Diagnosis Date   No significant past medical history     No past surgical history on file.   Family History  Problem Relation Age of Onset   Prostate cancer Father 77   Diabetes Father        type 2   Cancer Father        Stage 4 throat cancer   Hyperlipidemia Father    Hypertension Father    Heart disease Father        MI x 4, CAD; first at age 3   Alzheimer's disease Father    Heart attack Paternal Grandfather    Colon cancer Neg Hx     Social History   Tobacco Use   Smoking status: Never   Smokeless tobacco: Never  Vaping Use   Vaping Use: Never used  Substance Use Topics   Alcohol use: Yes    Comment: 1 drink every 3 weeks   Drug use: No    Review of Systems:   Review of Systems  Constitutional:  Negative for chills, fever, malaise/fatigue and weight loss.  HENT:  Negative for hearing loss, sinus pain and sore throat.   Respiratory:  Negative for cough, hemoptysis and shortness of  breath.   Cardiovascular:  Negative for chest pain, palpitations, leg swelling and PND.  Gastrointestinal:  Negative for abdominal pain, constipation, diarrhea, heartburn, nausea and vomiting.  Genitourinary:  Negative for dysuria, frequency and urgency.  Musculoskeletal:  Negative for back pain, myalgias and neck pain.  Skin:  Negative for itching and rash.  Neurological:  Negative for dizziness, tingling, seizures and headaches.  Endo/Heme/Allergies:  Negative for polydipsia.  Psychiatric/Behavioral:  Negative for depression. The patient is not nervous/anxious.     Objective:    Vitals:   01/17/23 0812  BP: 130/72  Pulse: 67  SpO2: 98%    Body mass index is 28.21 kg/m.  General  Alert, cooperative, no distress, appears stated age  Head:  Normocephalic, without obvious abnormality, atraumatic  Eyes:  PERRL,  conjunctiva/corneas clear, EOM's intact, fundi benign, both eyes       Ears:  Normal TM's and external ear canals, both ears  Nose: Nares normal, septum midline, mucosa normal, no drainage or sinus tenderness  Throat: Lips, mucosa, and tongue normal; teeth and gums normal  Neck: Supple, symmetrical, trachea midline, no adenopathy;     thyroid:  No enlargement/tenderness/nodules; no carotid bruit or JVD  Back:   Symmetric, no curvature, ROM normal, no CVA tenderness  Lungs:   Clear to auscultation bilaterally, respirations unlabored  Chest wall:  No tenderness or deformity  Heart:  Regular rate and rhythm, S1 and S2 normal, no murmur, rub or gallop  Abdomen:   Soft, non-tender, bowel sounds active all four quadrants, no masses, no organomegaly  Extremities: Extremities normal, atraumatic, no cyanosis or edema  Prostate : Deferred   Skin: Skin color, texture, turgor normal, no rashes or lesions  Lymph nodes: Cervical, supraclavicular, and axillary nodes normal  Neurologic: CNII-XII grossly intact. Normal strength, sensation and reflexes throughout   AssessmentPlan:   Routine physical examination Today patient counseled on age appropriate routine health concerns for screening and prevention, each reviewed and up to date or declined. Immunizations reviewed and up to date or declined. Labs ordered and reviewed. Risk factors for depression reviewed and negative. Hearing function and visual acuity are intact. ADLs screened and addressed as needed. Functional ability and level of safety reviewed and appropriate. Education, counseling and referrals performed based on assessed risks today. Patient provided with a copy of personalized plan for preventive services.  Hypertriglyceridemia Update lipid panel and make recommendations accordingly  Overweight Continue healthy lifestyle efforts  Family history of premature CAD Will obtain calcium score as he is turning 42 in a few months Low threshold to  refer to cardiology  Family history of prostate cancer in father Update PSA -- he is turning 42 in a few months   I,Alexander Ruley,acting as a Neurosurgeon for [employer], PA.,have documented all relevant documentation on the behalf of Jarold Motto, PA,as directed by  Jarold Motto, PA while in the presence of Jarold Motto, [place].   I, Jarold Motto, [place], have reviewed all documentation for this visit. The documentation on 01/17/23 for the exam, diagnosis, procedures, and orders are all accurate and complete.    Jarold Motto, PA-C [facility]

## 2023-01-12 ENCOUNTER — Encounter: Payer: 59 | Admitting: Physician Assistant

## 2023-01-17 ENCOUNTER — Encounter: Payer: Self-pay | Admitting: Physician Assistant

## 2023-01-17 ENCOUNTER — Ambulatory Visit (INDEPENDENT_AMBULATORY_CARE_PROVIDER_SITE_OTHER): Payer: 59 | Admitting: Physician Assistant

## 2023-01-17 VITALS — BP 130/72 | HR 67 | Ht 72.0 in | Wt 208.0 lb

## 2023-01-17 DIAGNOSIS — Z8042 Family history of malignant neoplasm of prostate: Secondary | ICD-10-CM | POA: Diagnosis not present

## 2023-01-17 DIAGNOSIS — Z8249 Family history of ischemic heart disease and other diseases of the circulatory system: Secondary | ICD-10-CM

## 2023-01-17 DIAGNOSIS — Z0000 Encounter for general adult medical examination without abnormal findings: Secondary | ICD-10-CM | POA: Diagnosis not present

## 2023-01-17 DIAGNOSIS — E781 Pure hyperglyceridemia: Secondary | ICD-10-CM | POA: Diagnosis not present

## 2023-01-17 DIAGNOSIS — E663 Overweight: Secondary | ICD-10-CM

## 2023-01-17 LAB — PSA: PSA: 0.92 ng/mL (ref 0.10–4.00)

## 2023-01-17 LAB — COMPREHENSIVE METABOLIC PANEL
ALT: 16 U/L (ref 0–53)
AST: 16 U/L (ref 0–37)
Albumin: 4.9 g/dL (ref 3.5–5.2)
Alkaline Phosphatase: 52 U/L (ref 39–117)
BUN: 16 mg/dL (ref 6–23)
CO2: 28 mEq/L (ref 19–32)
Calcium: 9.5 mg/dL (ref 8.4–10.5)
Chloride: 104 mEq/L (ref 96–112)
Creatinine, Ser: 0.99 mg/dL (ref 0.40–1.50)
GFR: 95.73 mL/min (ref >60.00)
Glucose, Bld: 99 mg/dL (ref 70–99)
Potassium: 4.5 mEq/L (ref 3.5–5.1)
Sodium: 140 mEq/L (ref 135–145)
Total Bilirubin: 1.1 mg/dL (ref 0.2–1.2)
Total Protein: 7.3 g/dL (ref 6.0–8.3)

## 2023-01-17 LAB — CBC
HCT: 44.7 % (ref 39.0–52.0)
Hemoglobin: 15.4 g/dL (ref 13.0–17.0)
MCHC: 34.5 g/dL (ref 30.0–36.0)
MCV: 85.3 fl (ref 78.0–100.0)
Platelets: 209 10*3/uL (ref 150.0–400.0)
RBC: 5.23 Mil/uL (ref 4.22–5.81)
RDW: 13.1 % (ref 11.5–15.5)
WBC: 4.7 10*3/uL (ref 4.0–10.5)

## 2023-01-17 LAB — LIPID PANEL
Cholesterol: 172 mg/dL (ref 0–200)
HDL: 34.8 mg/dL — ABNORMAL LOW (ref >39.00)
LDL Cholesterol: 113 mg/dL — ABNORMAL HIGH (ref 0–99)
NonHDL: 137.63
Total CHOL/HDL Ratio: 5
Triglycerides: 125 mg/dL (ref 0.0–149.0)
VLDL: 25 mg/dL (ref 0.0–40.0)

## 2023-01-17 NOTE — Patient Instructions (Addendum)
It was great to see you!  Let's order calcium score -- someone will reach out to you to schedule this  Keep up the good work running every day!  Please go to the lab for blood work.   Our office will call you with your results unless you have chosen to receive results via MyChart.  If your blood work is normal we will follow-up each year for physicals and as scheduled for chronic medical problems.  If anything is abnormal we will treat accordingly and get you in for a follow-up.  Take care,  Lelon Mast

## 2023-02-22 ENCOUNTER — Ambulatory Visit (HOSPITAL_BASED_OUTPATIENT_CLINIC_OR_DEPARTMENT_OTHER)
Admission: RE | Admit: 2023-02-22 | Discharge: 2023-02-22 | Disposition: A | Discharge status: Home / Self Care | Payer: 59 | Source: Ambulatory Visit | Attending: Physician Assistant | Admitting: Physician Assistant

## 2023-02-22 ENCOUNTER — Encounter (HOSPITAL_BASED_OUTPATIENT_CLINIC_OR_DEPARTMENT_OTHER): Payer: Self-pay

## 2023-02-22 DIAGNOSIS — E781 Pure hyperglyceridemia: Secondary | ICD-10-CM | POA: Insufficient documentation

## 2023-02-22 DIAGNOSIS — Z8249 Family history of ischemic heart disease and other diseases of the circulatory system: Secondary | ICD-10-CM | POA: Insufficient documentation

## 2023-03-05 ENCOUNTER — Encounter: Payer: Self-pay | Admitting: Physician Assistant

## 2023-03-06 ENCOUNTER — Other Ambulatory Visit: Payer: Self-pay | Admitting: Physician Assistant

## 2023-03-06 DIAGNOSIS — R911 Solitary pulmonary nodule: Secondary | ICD-10-CM

## 2023-05-25 ENCOUNTER — Other Ambulatory Visit: Payer: Self-pay | Admitting: *Deleted

## 2023-05-25 DIAGNOSIS — R911 Solitary pulmonary nodule: Secondary | ICD-10-CM

## 2023-05-28 ENCOUNTER — Inpatient Hospital Stay: Admission: RE | Admit: 2023-05-28 | Payer: 59 | Source: Ambulatory Visit

## 2023-05-28 ENCOUNTER — Ambulatory Visit
Admission: RE | Admit: 2023-05-28 | Discharge: 2023-05-28 | Disposition: A | Discharge status: Home / Self Care | Payer: 59 | Source: Ambulatory Visit | Attending: Physician Assistant | Admitting: Physician Assistant

## 2023-05-28 DIAGNOSIS — R911 Solitary pulmonary nodule: Secondary | ICD-10-CM

## 2023-06-11 ENCOUNTER — Encounter: Payer: Self-pay | Admitting: Physician Assistant

## 2023-06-12 ENCOUNTER — Other Ambulatory Visit: Payer: Self-pay | Admitting: Physician Assistant

## 2023-06-12 MED ORDER — ATORVASTATIN CALCIUM 20 MG PO TABS: 20.0000 mg | ORAL_TABLET | Freq: Every day | ORAL | 3 refills | Status: DC

## 2024-01-12 IMAGING — DX DG KNEE AP/LAT W/ SUNRISE*L*
3 series · 3 of 3 positions shown · non-contrast
Comparison: None.

CLINICAL DATA: Chronic pain of left knee. Left knee pain. Pain for
3 months. No known injury.

EXAM:
LEFT KNEE 3 VIEWS

[knee ap]
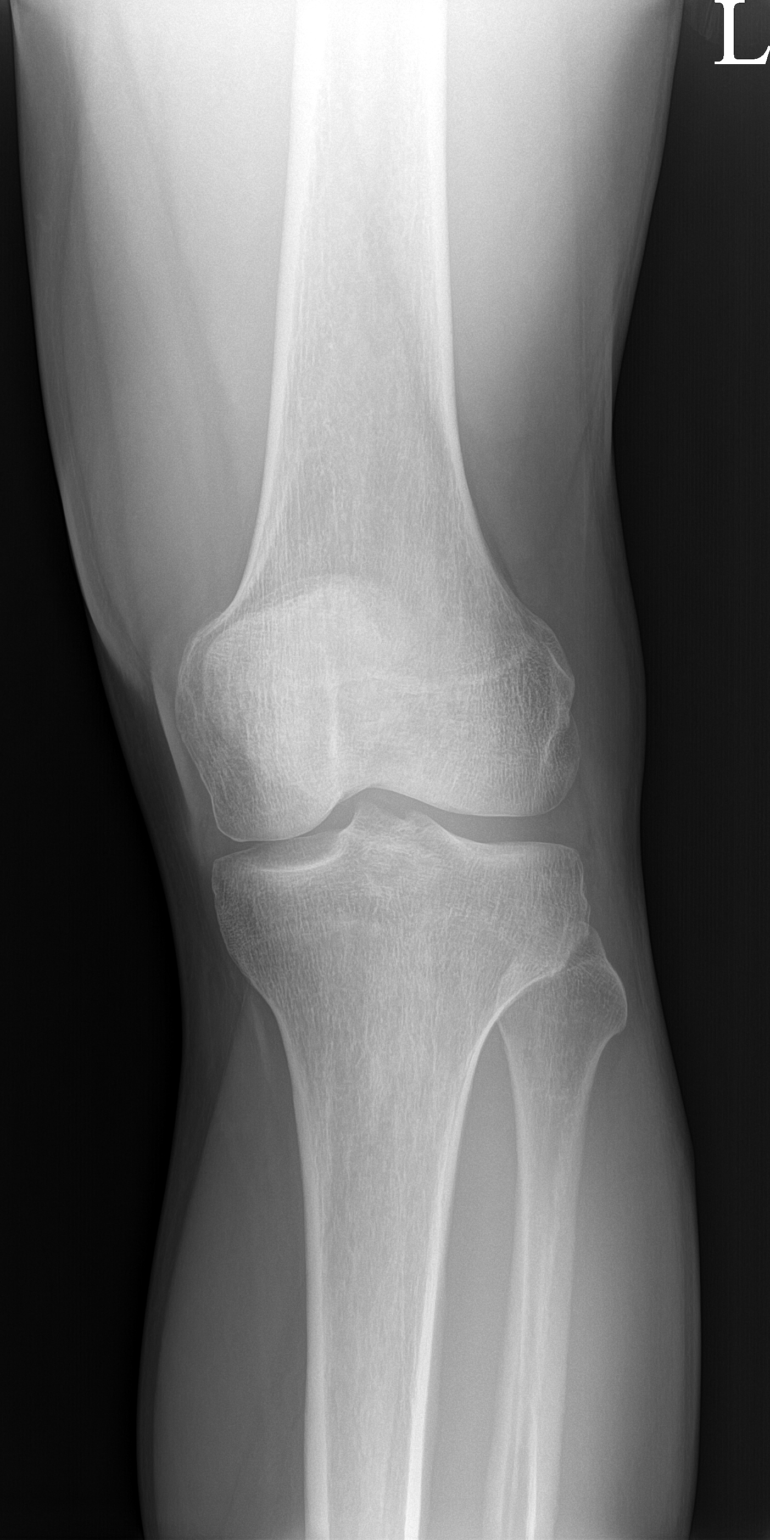

[knee lat]
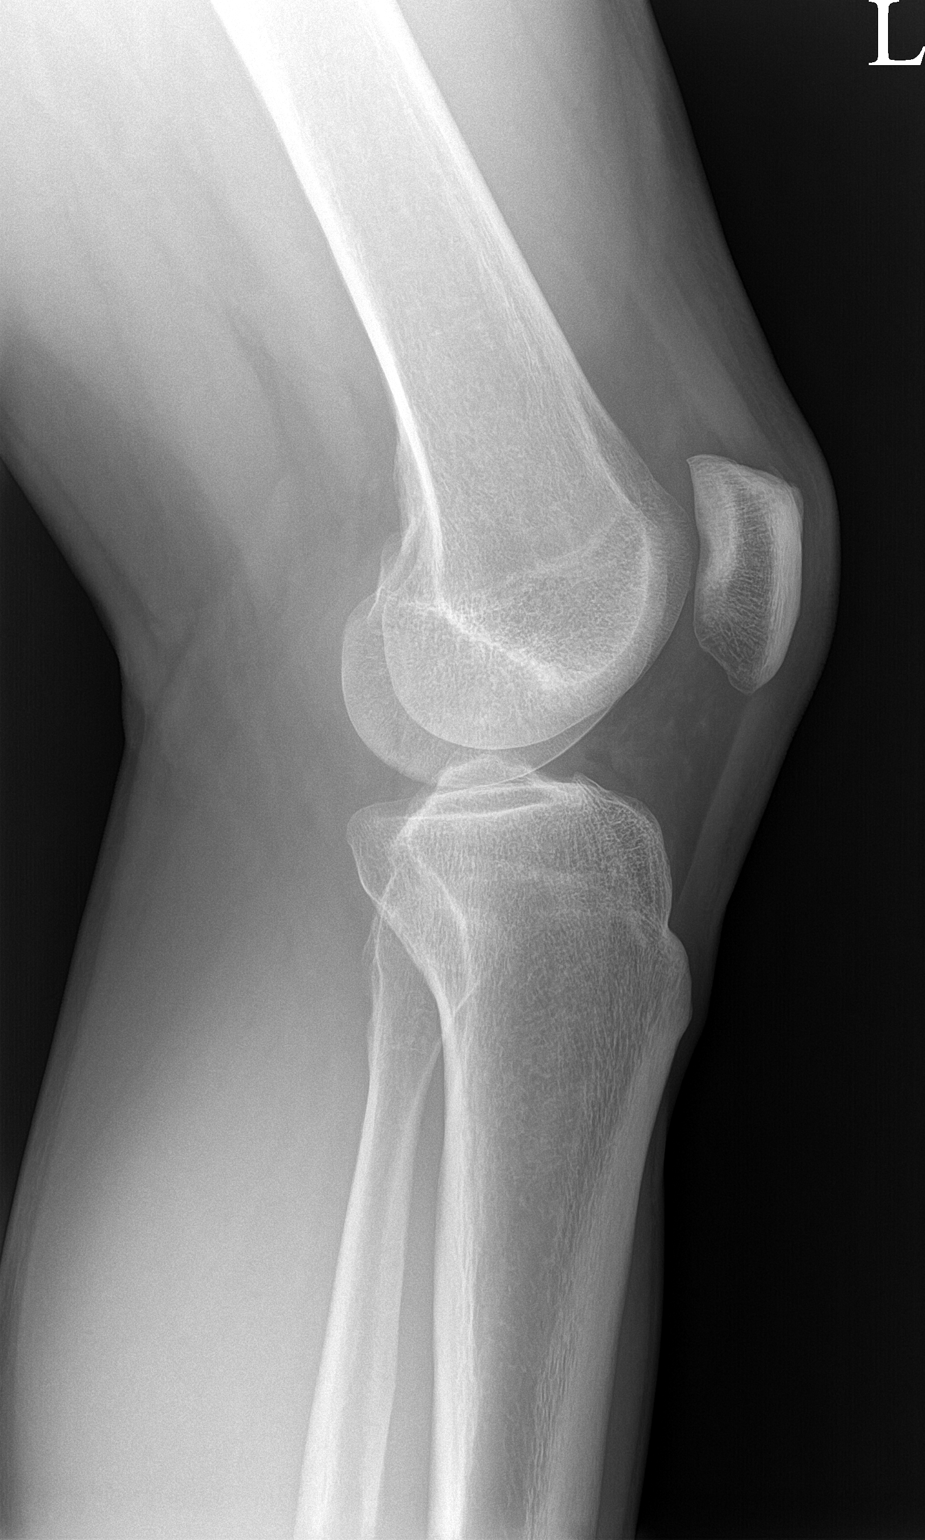

[patella]
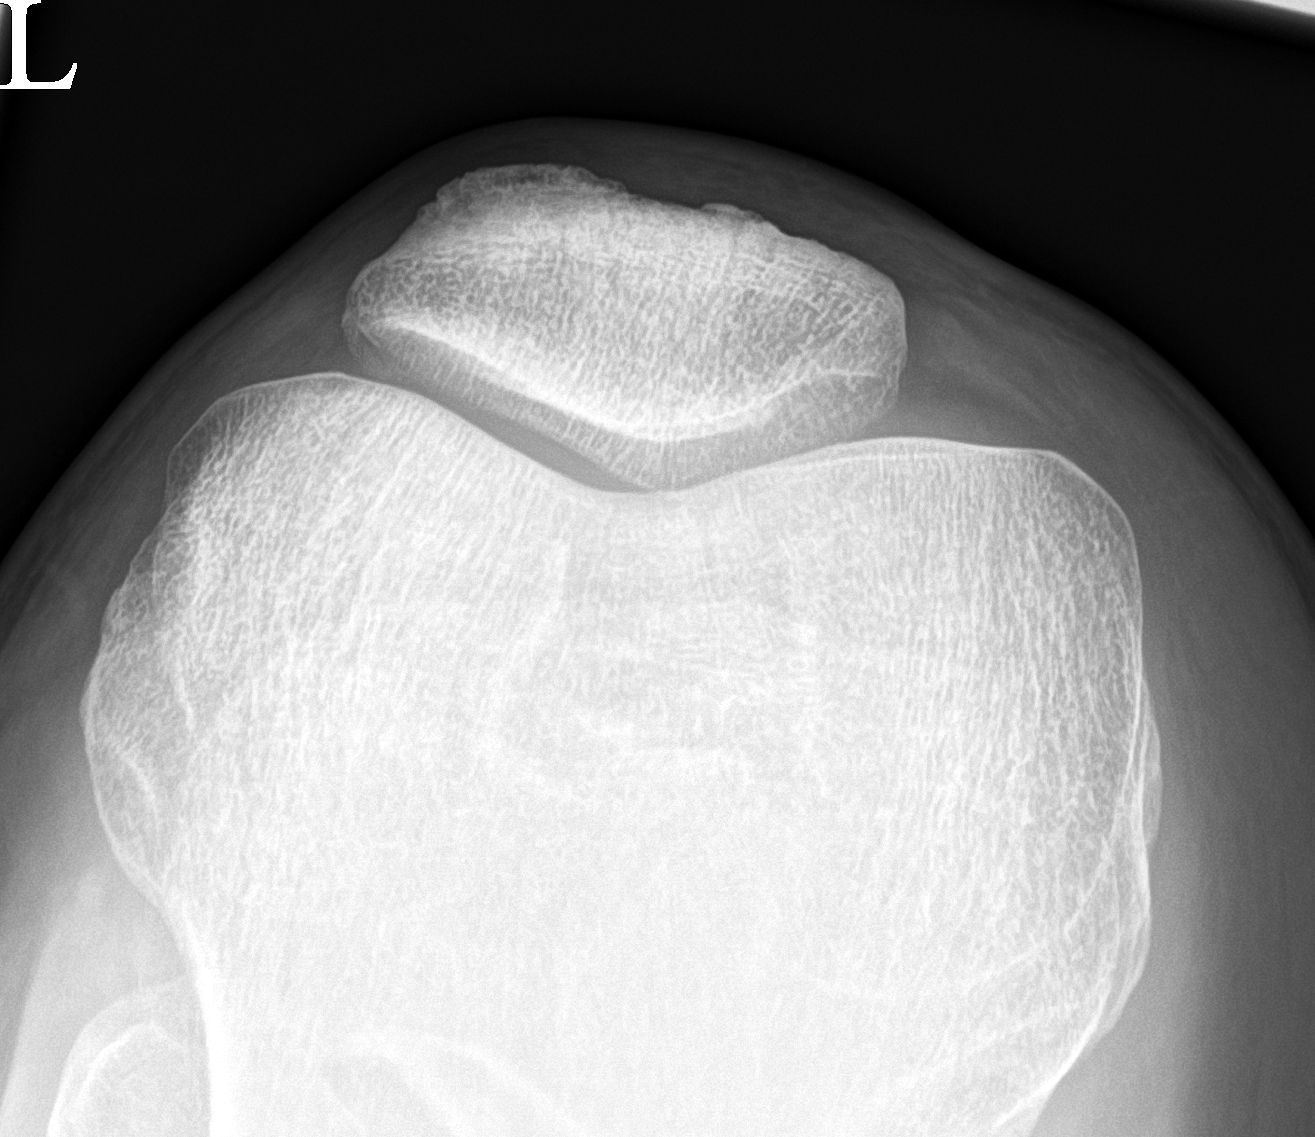

[3 of 3 positions shown; findings below may reference images not displayed]

FINDINGS: No evidence of fracture, dislocation, or joint effusion. Normal
alignment. The joint spaces are preserved. No evidence of
arthropathy or other focal bone abnormality. Soft tissues are
unremarkable.
IMPRESSION: Negative radiographs of the left knee.

## 2024-01-29 ENCOUNTER — Encounter: Payer: Self-pay | Admitting: Physician Assistant

## 2024-01-29 ENCOUNTER — Ambulatory Visit (INDEPENDENT_AMBULATORY_CARE_PROVIDER_SITE_OTHER): Admitting: Physician Assistant

## 2024-01-29 VITALS — BP 122/84 | HR 76 | Temp 97.5°F | Ht 72.0 in | Wt 214.2 lb

## 2024-01-29 DIAGNOSIS — Z8042 Family history of malignant neoplasm of prostate: Secondary | ICD-10-CM

## 2024-01-29 DIAGNOSIS — E559 Vitamin D deficiency, unspecified: Secondary | ICD-10-CM

## 2024-01-29 DIAGNOSIS — E663 Overweight: Secondary | ICD-10-CM

## 2024-01-29 DIAGNOSIS — E785 Hyperlipidemia, unspecified: Secondary | ICD-10-CM

## 2024-01-29 DIAGNOSIS — R911 Solitary pulmonary nodule: Secondary | ICD-10-CM | POA: Diagnosis not present

## 2024-01-29 DIAGNOSIS — R238 Other skin changes: Secondary | ICD-10-CM

## 2024-01-29 DIAGNOSIS — Z1159 Encounter for screening for other viral diseases: Secondary | ICD-10-CM

## 2024-01-29 DIAGNOSIS — Z0000 Encounter for general adult medical examination without abnormal findings: Secondary | ICD-10-CM

## 2024-01-29 LAB — CBC WITH DIFFERENTIAL/PLATELET
Basophils Absolute: 0 10*3/uL (ref 0.0–0.1)
Basophils Relative: 0.5 % (ref 0.0–3.0)
Eosinophils Absolute: 0.1 10*3/uL (ref 0.0–0.7)
Eosinophils Relative: 2.2 % (ref 0.0–5.0)
HCT: 43 % (ref 39.0–52.0)
Hemoglobin: 15.1 g/dL (ref 13.0–17.0)
Lymphocytes Relative: 20.7 % (ref 12.0–46.0)
Lymphs Abs: 1 10*3/uL (ref 0.7–4.0)
MCHC: 35.2 g/dL (ref 30.0–36.0)
MCV: 82.9 fl (ref 78.0–100.0)
Monocytes Absolute: 0.4 10*3/uL (ref 0.1–1.0)
Monocytes Relative: 9 % (ref 3.0–12.0)
Neutro Abs: 3.3 10*3/uL (ref 1.4–7.7)
Neutrophils Relative %: 67.6 % (ref 43.0–77.0)
Platelets: 202 10*3/uL (ref 150.0–400.0)
RBC: 5.18 Mil/uL (ref 4.22–5.81)
RDW: 12.8 % (ref 11.5–15.5)
WBC: 4.9 10*3/uL (ref 4.0–10.5)

## 2024-01-29 LAB — LIPID PANEL
Cholesterol: 121 mg/dL (ref 0–200)
HDL: 37.9 mg/dL — ABNORMAL LOW (ref >39.00)
LDL Cholesterol: 58 mg/dL (ref 0–99)
NonHDL: 82.94
Total CHOL/HDL Ratio: 3
Triglycerides: 124 mg/dL (ref 0.0–149.0)
VLDL: 24.8 mg/dL (ref 0.0–40.0)

## 2024-01-29 LAB — COMPREHENSIVE METABOLIC PANEL WITH GFR
ALT: 26 U/L (ref 0–53)
AST: 18 U/L (ref 0–37)
Albumin: 4.9 g/dL (ref 3.5–5.2)
Alkaline Phosphatase: 54 U/L (ref 39–117)
BUN: 15 mg/dL (ref 6–23)
CO2: 29 meq/L (ref 19–32)
Calcium: 9.5 mg/dL (ref 8.4–10.5)
Chloride: 103 meq/L (ref 96–112)
Creatinine, Ser: 0.94 mg/dL (ref 0.40–1.50)
GFR: 101.14 mL/min (ref >60.00)
Glucose, Bld: 99 mg/dL (ref 70–99)
Potassium: 4 meq/L (ref 3.5–5.1)
Sodium: 140 meq/L (ref 135–145)
Total Bilirubin: 1.3 mg/dL — ABNORMAL HIGH (ref 0.2–1.2)
Total Protein: 7.1 g/dL (ref 6.0–8.3)

## 2024-01-29 LAB — PSA: PSA: 0.82 ng/mL (ref 0.10–4.00)

## 2024-01-29 LAB — VITAMIN D 25 HYDROXY (VIT D DEFICIENCY, FRACTURES): VITD: 30.96 ng/mL (ref 30.00–100.00)

## 2024-01-29 LAB — TSH: TSH: 2.34 u[IU]/mL (ref 0.35–5.50)

## 2024-01-29 LAB — VITAMIN B12: Vitamin B-12: 347 pg/mL (ref 211–911)

## 2024-01-29 MED ORDER — TRIAMCINOLONE ACETONIDE 0.025 % EX CREA: 1.0000 | TOPICAL_CREAM | Freq: Two times a day (BID) | CUTANEOUS | 1 refills | Status: AC

## 2024-01-29 NOTE — Progress Notes (Signed)
 Subjective:    Derrick Jensen is a 43 y.o. male and is here for a comprehensive physical exam.  HPI  Health Maintenance Due  Topic Date Due   Hepatitis C Screening  Never done    Acute Concerns: Neck problem Pt complains of sensitivity to touch at the back of his neck starting a few weeks ago.  He reports the the sensitivity feels superficial and is mostly right-sided. He reports the collar of his shirt is bothering him today and that turning his head side to side triggers discomfort. He states direct touch does not cause discomfort, but grazing the area does. Denies rashes, bumps, pain radiation down the arm, recent travel, or changes to pillows.  Chronic Issues: Hyperlipidemia Pt is on Atorvastatin  20 mg daily. Good compliance and tolerance. He states his neck sensitivity might be related to Atorvastatin . Denies taking any supplements.  Lung nodule He had a CT of his chest in Oct 2024 that showed a 6 mm lung nodule -- this was recommended to have 6 month(s) repeat CT follow up to further assess He reports that he had an illness at time of this CT  Denies cough, weight loss, night sweats  Health Maintenance: Immunizations -- See above. Colonoscopy -- N/a PSA --  Lab Results  Component Value Date   PSA 0.92 01/17/2023   Diet -- Does not drink soda; moderately balanced diet. Sleep habits -- Good sleeping habits. Exercise -- Walks dog daily.  Weight --  Recent weight history Wt Readings from Last 10 Encounters:  01/29/24 214 lb 4 oz (97.2 kg)  01/17/23 208 lb (94.3 kg)  01/06/22 205 lb (93 kg)  09/30/21 213 lb 9.6 oz (96.9 kg)  08/19/21 208 lb 6.4 oz (94.5 kg)  08/04/20 205 lb 3.2 oz (93.1 kg)  06/16/19 198 lb 12.8 oz (90.2 kg)  05/14/18 215 lb 3.2 oz (97.6 kg)  07/13/17 225 lb 4.8 oz (102.2 kg)  02/13/17 228 lb 3.2 oz (103.5 kg)   Body mass index is 29.06 kg/m.  Mood -- Stable. Alcohol use --  reports current alcohol use.  Tobacco use --  Tobacco Use:  Low Risk  (01/29/2024)   Patient History    Smoking Tobacco Use: Never    Smokeless Tobacco Use: Never    Passive Exposure: Not on file    Eligible for Low Dose CT? No  UTD with eye doctor? UTD; seen last week UTD with dentist? No UTD with dermatology? No     01/29/2024    7:59 AM  Depression screen PHQ 2/9  Decreased Interest 0  Down, Depressed, Hopeless 0  PHQ - 2 Score 0    Other providers/specialists: Patient Care Team: Job Lukes, [place] as PCP - General (Physician Assistant)    PMHx, SurgHx, SocialHx, Medications, and Allergies were reviewed in the Visit Navigator and updated as appropriate.   Past Medical History:  Diagnosis Date   No significant past medical history     No past surgical history on file.   Family History  Problem Relation Age of Onset   Prostate cancer Father 63   Diabetes Father        type 2   Cancer Father        Stage 4 throat cancer   Hyperlipidemia Father    Hypertension Father    Heart disease Father        MI x 4, CAD; first at age 27   Alzheimer's disease Father    Heart attack Paternal  Grandfather    Colon cancer Neg Hx     Social History   Tobacco Use   Smoking status: Never   Smokeless tobacco: Never  Vaping Use   Vaping status: Never Used  Substance Use Topics   Alcohol use: Yes    Comment: social   Drug use: No    Review of Systems:   Review of Systems  Constitutional:  Negative for chills, fever, malaise/fatigue and weight loss.  HENT:  Negative for hearing loss, sinus pain and sore throat.   Respiratory:  Negative for cough and hemoptysis.   Cardiovascular:  Negative for chest pain, palpitations, leg swelling and PND.  Gastrointestinal:  Negative for abdominal pain, constipation, diarrhea, heartburn, nausea and vomiting.  Genitourinary:  Negative for dysuria, frequency and urgency.  Musculoskeletal:  Negative for back pain, myalgias and neck pain.  Skin:  Negative for itching and rash.  Neurological:   Negative for dizziness, tingling, seizures and headaches.  Endo/Heme/Allergies:  Negative for polydipsia.  Psychiatric/Behavioral:  Negative for depression. The patient is not nervous/anxious.     Objective:    Vitals:   01/29/24 0800  BP: 122/84  Pulse: 76  Temp: (!) 97.5 F (36.4 C)  SpO2: 98%    Body mass index is 29.06 kg/m.  General  Alert, cooperative, no distress, appears stated age  Head:  Normocephalic, without obvious abnormality, atraumatic  Eyes:  PERRL, conjunctiva/corneas clear, EOM's intact, fundi benign, both eyes       Ears:  Normal TM's and external ear canals, both ears  Nose: Nares normal, septum midline, mucosa normal, no drainage or sinus tenderness  Throat: Lips, mucosa, and tongue normal; teeth and gums normal  Neck: Supple, symmetrical, trachea midline, no adenopathy;     thyroid :  No enlargement/tenderness/nodules; no carotid bruit or JVD  Back:   Symmetric, no curvature, ROM normal, no CVA tenderness  Lungs:   Clear to auscultation bilaterally, respirations unlabored  Chest wall:  No tenderness or deformity  Heart:  Regular rate and rhythm, S1 and S2 normal, no murmur, rub or gallop  Abdomen:   Soft, non-tender, bowel sounds active all four quadrants, no masses, no organomegaly  Extremities: Extremities normal, atraumatic, no cyanosis or edema  Prostate : Deferred   Skin: Skin color, texture, turgor normal, no rashes or lesions  Lymph nodes: Cervical, supraclavicular, and axillary nodes normal  Neurologic: CNII-XII grossly intact. Normal strength, sensation and reflexes throughout   AssessmentPlan:   Routine physical examination Today patient counseled on age appropriate routine health concerns for screening and prevention, each reviewed and up to date or declined. Immunizations reviewed and up to date or declined. Labs ordered and reviewed. Risk factors for depression reviewed and negative. Hearing function and visual acuity are intact. ADLs  screened and addressed as needed. Functional ability and level of safety reviewed and appropriate. Education, counseling and referrals performed based on assessed risks today. Patient provided with a copy of personalized plan for preventive services.  Family history of prostate cancer in father Update PSA   Lung nodule Encouraged repeat CT as recommended - he declined  Skin sensitivity Unclear etiology We will plan to hold statin to rule out possible side effect(s) Update blood work as well to check TSH, B12 I have asked him to let me know after two weeks what happens with holding statin and we will advise on next steps  Vitamin D deficiency Update vitamin D   Encounter for screening for other viral diseases Update hepatitis C  Hyperlipidemia, unspecified hyperlipidemia type Update lipid panel and make recommendations accordingly to statin accordingly  Overweight Continue efforts at healthy diet and exercise   I, Lavern Simmers, acting as a Neurosurgeon for [employer], [place]., have documented all relevant documentation on the behalf of Lucie Buttner, [place], as directed by Lucie Buttner, PA while in the presence of Lucie Buttner, [place].  I, Lavern KATHEE Simmers, have reviewed all documentation for this visit. The documentation on 01/29/24 for the exam, diagnosis, procedures, and orders are all accurate and complete.  Lucie Buttner, PA-C [facility]

## 2024-01-29 NOTE — Patient Instructions (Signed)
 It was great to see you!  Trial steroid cream to your leg Recommend repeating CT scan to recheck nodule Stop statin x 2 weeks -- message me after this to let me know if this made a difference and we will advise further  Let's follow-up in 1 year for Comprehensive Physical Exam (CPE) preventive care annual visit, sooner if you have concerns.  Take care,  Lucie Buttner PA-C

## 2024-01-30 ENCOUNTER — Ambulatory Visit: Payer: Self-pay | Admitting: Physician Assistant

## 2024-01-30 LAB — HEPATITIS C ANTIBODY: Hepatitis C Ab: NONREACTIVE

## 2024-02-11 ENCOUNTER — Encounter: Payer: Self-pay | Admitting: Physician Assistant

## 2024-02-11 ENCOUNTER — Other Ambulatory Visit: Payer: Self-pay | Admitting: Physician Assistant

## 2024-02-11 MED ORDER — PRAVASTATIN SODIUM 10 MG PO TABS: 10.0000 mg | ORAL_TABLET | Freq: Every day | ORAL | 1 refills | Status: DC

## 2024-03-11 ENCOUNTER — Other Ambulatory Visit: Payer: Self-pay

## 2024-03-11 ENCOUNTER — Other Ambulatory Visit: Payer: Self-pay | Admitting: Physician Assistant

## 2024-03-11 MED ORDER — PRAVASTATIN SODIUM 10 MG PO TABS
10.0000 mg | ORAL_TABLET | Freq: Every day | ORAL | 1 refills | Status: DC
Start: 2024-03-11 — End: 2024-06-09

## 2024-04-15 ENCOUNTER — Other Ambulatory Visit: Payer: Self-pay | Admitting: Physician Assistant

## 2024-04-15 ENCOUNTER — Ambulatory Visit: Payer: Self-pay | Admitting: Physician Assistant

## 2024-04-15 DIAGNOSIS — R911 Solitary pulmonary nodule: Secondary | ICD-10-CM

## 2024-04-22 ENCOUNTER — Ambulatory Visit
Admission: RE | Admit: 2024-04-22 | Discharge: 2024-04-22 | Disposition: A | Discharge status: Home / Self Care | Source: Ambulatory Visit | Attending: Physician Assistant | Admitting: Physician Assistant

## 2024-04-22 DIAGNOSIS — R911 Solitary pulmonary nodule: Secondary | ICD-10-CM

## 2024-04-23 ENCOUNTER — Ambulatory Visit: Payer: Self-pay | Admitting: Physician Assistant

## 2024-04-29 ENCOUNTER — Other Ambulatory Visit: Payer: Self-pay | Admitting: Physician Assistant

## 2024-04-29 DIAGNOSIS — N289 Disorder of kidney and ureter, unspecified: Secondary | ICD-10-CM

## 2024-05-13 ENCOUNTER — Ambulatory Visit
Admission: RE | Admit: 2024-05-13 | Discharge: 2024-05-13 | Disposition: A | Discharge status: Home / Self Care | Source: Ambulatory Visit | Attending: Physician Assistant | Admitting: Physician Assistant

## 2024-05-13 DIAGNOSIS — N289 Disorder of kidney and ureter, unspecified: Secondary | ICD-10-CM

## 2024-05-15 ENCOUNTER — Ambulatory Visit: Payer: Self-pay | Admitting: Physician Assistant

## 2024-06-08 ENCOUNTER — Other Ambulatory Visit: Payer: Self-pay | Admitting: Physician Assistant

## 2024-07-27 ENCOUNTER — Ambulatory Visit
Admission: RE | Admit: 2024-07-27 | Discharge: 2024-07-27 | Disposition: A | Discharge status: Home / Self Care | Attending: Physician Assistant | Admitting: Physician Assistant

## 2024-07-27 ENCOUNTER — Other Ambulatory Visit: Payer: Self-pay

## 2024-07-27 ENCOUNTER — Ambulatory Visit: Payer: Self-pay | Admitting: Physician Assistant

## 2024-07-27 ENCOUNTER — Ambulatory Visit: Admitting: Radiology

## 2024-07-27 VITALS — BP 143/84 | HR 67 | Temp 98.0°F | Resp 17 | Ht 72.0 in | Wt 210.0 lb

## 2024-07-27 DIAGNOSIS — M25421 Effusion, right elbow: Secondary | ICD-10-CM | POA: Diagnosis not present

## 2024-07-27 NOTE — Discharge Instructions (Signed)
 You were seen today for concerns of swelling of the right elbow.  This time I am not sure what is causing the swelling and your x-ray only revealed that there is a soft tissue lucency in that area.  X-ray is not equipped to evaluate soft tissues and I still recommend following up with your primary care provider or an orthopedic provider for evaluation with ultrasound or another imaging modality to determine what is causing the swelling.  If you notice more severe symptoms such as difficulty moving your arm, increased swelling, redness or pain, fever or chills, numbness in your arm please go to the emergency room as these could be signs of a more significant medical problem.

## 2024-07-27 NOTE — Progress Notes (Signed)
 Imaging does not show evidence of osseous abnormality but they did find a soft tissue lucency corresponding to the area of swelling. Recommend follow up as discussed with his PCP or Ortho for evaluation with ultrasound or CT, MRI.

## 2024-07-27 NOTE — ED Provider Notes (Signed)
 " [facility]    CSN: [id number] Arrival date & time: 07/27/24  1049      History   Chief Complaint Chief Complaint  Patient presents with   Arm Swelling    HPI Derrick Jensen is a 43 y.o. male.   HPI  Pt is here today with concerns for right arm swelling along the anterior aspect of the elbow. He denies injury,trauma, recent blood draw or pain to the area He denies bruising increased warmth or decreased ROM.  He states he woke up with the swelling yesterday but it has not impeded his ability to use his arm- states he played a full round of golf yesterday without issues.  He denies recent heavy lifting or feeling popping sensation along the upper arm.  He denies numbness or tingling in distal aspect of right arm      Past Medical History:  Diagnosis Date   No significant past medical history     Patient Active Problem List   Diagnosis Date Noted   OSA (obstructive sleep apnea) 07/13/2017   Hypertriglyceridemia 01/16/2013   Family history of premature CAD 01/02/2013    History reviewed. No pertinent surgical history.     Home Medications    Prior to Admission medications  Medication Sig Start Date End Date Taking? Authorizing Provider  pravastatin  (PRAVACHOL ) 10 MG tablet TAKE 1 TABLET(10 MG) BY MOUTH DAILY 06/09/24   Job Lukes, PA  triamcinolone  (KENALOG ) 0.025 % cream Apply 1 Application topically 2 (two) times daily. 01/29/24   Job Lukes, PA    Family History Family History  Problem Relation Age of Onset   Prostate cancer Father 40   Diabetes Father        type 2   Cancer Father        Stage 4 throat cancer   Hyperlipidemia Father    Hypertension Father    Heart disease Father        MI x 4, CAD; first at age 12   Alzheimer's disease Father    Heart attack Paternal Grandfather    Colon cancer Neg Hx     Social History Social History[1]   Allergies   Patient has no known allergies.   Review of Systems Review of  Systems  Musculoskeletal:  Positive for joint swelling.     Physical Exam Triage Vital Signs ED Triage Vitals  Encounter Vitals Group     BP 07/27/24 1117 (!) 143/84     Girls Systolic BP Percentile --      Girls Diastolic BP Percentile --      Boys Systolic BP Percentile --      Boys Diastolic BP Percentile --      Pulse Rate 07/27/24 1117 67     Resp 07/27/24 1117 17     Temp 07/27/24 1117 98 F (36.7 C)     Temp Source 07/27/24 1117 Oral     SpO2 07/27/24 1117 98 %     Weight 07/27/24 1116 210 lb (95.3 kg)     Height 07/27/24 1116 6' (1.829 m)     Head Circumference --      Peak Flow --      Pain Score 07/27/24 1116 0     Pain Loc --      Pain Education --      Exclude from Growth Chart --    No data found.  Updated Vital Signs BP (!) 143/84 (BP Location: Left Arm)   Pulse 67  Temp 98 F (36.7 C) (Oral)   Resp 17   Ht 6' (1.829 m)   Wt 210 lb (95.3 kg)   SpO2 98%   BMI 28.48 kg/m   Visual Acuity Right Eye Distance:   Left Eye Distance:   Bilateral Distance:    Right Eye Near:   Left Eye Near:    Bilateral Near:     Physical Exam Vitals reviewed.  Constitutional:      General: He is awake.     Appearance: Normal appearance. He is well-developed and well-groomed.  HENT:     Head: Normocephalic and atraumatic.  Eyes:     Extraocular Movements: Extraocular movements intact.     Conjunctiva/sclera: Conjunctivae normal.  Pulmonary:     Effort: Pulmonary effort is normal.  Musculoskeletal:       Arms:     Cervical back: Normal range of motion.  Neurological:     Mental Status: He is alert and oriented to person, place, and time.  Psychiatric:        Attention and Perception: Attention normal.        Mood and Affect: Mood normal.        Speech: Speech normal.        Behavior: Behavior normal. Behavior is cooperative.        Thought Content: Thought content normal.        Judgment: Judgment normal.      UC Treatments / Results  Labs (all  labs ordered are listed, but only abnormal results are displayed) Labs Reviewed - No data to display  EKG   Radiology DG Elbow Complete Right Result Date: 07/27/2024 EXAM: 3 VIEW(S) XRAY OF THE ELBOW COMPARISON: None available. CLINICAL HISTORY: swelling of anterior elbow area FINDINGS: BONES AND JOINTS: No acute fracture. No malalignment. Limited evaluation due to overlapping osseous structures and overlying soft tissues. SOFT TISSUES: Focal 7.4 x 4.3 cm soft tissue lucency at the anterior elbow area. Limited evaluation due to overlapping osseous structures and overlying soft tissues. IMPRESSION: 1. Focal 7.4 x 4.3 cm lucency in the soft tissue at the anterior elbow, with limited evaluation due to overlapping osseous structures and overlying soft tissues. Electronically signed by: Morgane Naveau MD 07/27/2024 01:33 PM EST RP Workstation: [id number]    Procedures Procedures (including critical care time)  Medications Ordered in UC Medications - No data to display  Initial Impression / Assessment and Plan / UC Course  I have reviewed the triage vital signs and the nursing notes.  Pertinent labs & imaging results that were available during my care of the patient were reviewed by me and considered in my medical decision making (see chart for details).      Final Clinical Impressions(s) / UC Diagnoses   Final diagnoses:  Swelling of right elbow   Patient presents today with concerns of rapid swelling of the anterior aspect of the right elbow.  Physical exam is notable for approximately 5 cm diameter semifirm nodular mass along the anterior aspect of the antecubital space of the right arm.  He denies pain, decreased range of motion or recent injuries.  Right biceps appear to be intact this does not appear to be a Popeye deformity.  Concern for potential lipoma given physical exam but with rapid development I am skeptical of this as etiology.  Will get x-ray today and I do recommend  follow-up with orthopedics and primary care for evaluation with ultrasound or other imaging for more detailed views. Pt voices understanding and  agreement of recommendations. Reviewed signs and symptoms of more severe condition such as numbness in the distal aspect of the arm, swelling and redness or pain, difficulty moving the elbow, increased swelling, fever or chills.    Discharge Instructions      You were seen today for concerns of swelling of the right elbow.  This time I am not sure what is causing the swelling and your x-ray only revealed that there is a soft tissue lucency in that area.  X-ray is not equipped to evaluate soft tissues and I still recommend following up with your primary care provider or an orthopedic provider for evaluation with ultrasound or another imaging modality to determine what is causing the swelling.  If you notice more severe symptoms such as difficulty moving your arm, increased swelling, redness or pain, fever or chills, numbness in your arm please go to the emergency room as these could be signs of a more significant medical problem.     ED Prescriptions   None    PDMP not reviewed this encounter.     [1]  Social History Tobacco Use   Smoking status: Never   Smokeless tobacco: Never  Vaping Use   Vaping status: Never Used  Substance Use Topics   Alcohol use: Yes    Comment: social   Drug use: No     Jobany Montellano, [place] BRAVO, PA-C 07/27/24 1525  "

## 2024-07-27 NOTE — ED Triage Notes (Signed)
 Pt presents with a chief complaint of anterior right arm swelling (elbow area). Noticed this yesterday. Denies recent injuries. No pain in the area. No medications taken PTA for symptoms reported.

## 2024-07-28 ENCOUNTER — Other Ambulatory Visit: Payer: Self-pay | Admitting: Physician Assistant

## 2024-07-28 ENCOUNTER — Encounter: Payer: Self-pay | Admitting: Physician Assistant

## 2024-07-28 DIAGNOSIS — M25429 Effusion, unspecified elbow: Secondary | ICD-10-CM

## 2024-07-30 ENCOUNTER — Ambulatory Visit (INDEPENDENT_AMBULATORY_CARE_PROVIDER_SITE_OTHER): Admitting: Family Medicine

## 2024-07-30 ENCOUNTER — Encounter: Payer: Self-pay | Admitting: Family Medicine

## 2024-07-30 ENCOUNTER — Other Ambulatory Visit: Payer: Self-pay

## 2024-07-30 VITALS — BP 120/84 | HR 80 | Ht 72.0 in | Wt 219.0 lb

## 2024-07-30 DIAGNOSIS — M25521 Pain in right elbow: Secondary | ICD-10-CM | POA: Diagnosis not present

## 2024-07-30 DIAGNOSIS — R2231 Localized swelling, mass and lump, right upper limb: Secondary | ICD-10-CM | POA: Insufficient documentation

## 2024-07-30 NOTE — Patient Instructions (Addendum)
 [facility] to see you! We will contact you once we receive the results

## 2024-07-30 NOTE — Assessment & Plan Note (Signed)
 Quickly growing elbow mass.  On ultrasound seems to have more fatty material, appears to be more of a lipoma than anything else.  Not picking up any significant abnormal vascularity.  Does not seem to be communicating with the overlying brachial radialis or the underlying bicep.  Does get fairly close to potentially the ulnar nerve that could be causing compression and giving patient some of the numbness in the fingertips.  Weakness.  Due to the timeline though and this growing rapidly over the course of the last week I do think further evaluation with an MRI is necessary.  Will get an MRI without contrast and see what the initial read is and if any other further imaging is necessary.  Patient has no fevers, chills, any abnormal weight loss or once again with this occurring in being relatively large in the last week with no injury further evaluation with advanced imaging is needed.

## 2024-07-30 NOTE — Progress Notes (Signed)
 " [facility] [address] [place] [address] Phone: [phone] Subjective:   ISusannah Gully, am serving as a scribe for Dr. Arthea Claudene.  I'm seeing this patient by the request  of:  Job Lukes, [place]  CC: Right elbow pain  YEP:Dlagzrupcz  Derrick Jensen is a 43 y.o. male coming in with complaint of right elbow pain and swelling.  Seen in urgent care 3 days ago.  Was having rapid swelling of the elbow. No pain or tenderness just swelling.    Right elbow x-rays on the 28th were independently visualized by me showing that there was a soft tissue change of 7.4 and 4 cm on the anterior aspect of the elbow.    Past Medical History:  Diagnosis Date   No significant past medical history    No past surgical history on file. Social History   Socioeconomic History   Marital status: Married    Spouse name: Not on file   Number of children: Not on file   Years of education: 16   Highest education level: Bachelor's degree (e.g., BA, AB, BS)  Occupational History    Employer: [employer]  Tobacco Use   Smoking status: Never   Smokeless tobacco: Never  Vaping Use   Vaping status: Never Used  Substance and Sexual Activity   Alcohol use: Yes    Comment: social   Drug use: No   Sexual activity: Yes    Birth control/protection: Condom    Comment: 1 partner  Other Topics Concern   Not on file  Social History Narrative   Mortgage insurance   Married   Social Drivers of Health   Tobacco Use: Low Risk (07/27/2024)   Patient History    Smoking Tobacco Use: Never    Smokeless Tobacco Use: Never    Passive Exposure: Not on file  Financial Resource Strain: Low Risk (01/28/2024)   Overall Financial Resource Strain (CARDIA)    Difficulty of Paying Living Expenses: Not hard at all  Food Insecurity: No Food Insecurity (01/28/2024)   Epic    Worried About Radiation Protection Practitioner of Food in the Last Year: Never true    Ran Out of Food in the Last  Year: Never true  Transportation Needs: No Transportation Needs (01/28/2024)   Epic    Lack of Transportation (Medical): No    Lack of Transportation (Non-Medical): No  Physical Activity: Insufficiently Active (01/28/2024)   Exercise Vital Sign    Days of Exercise per Week: 4 days    Minutes of Exercise per Session: 30 min  Stress: No Stress Concern Present (01/28/2024)   [employer] of Occupational Health - Occupational Stress Questionnaire    Feeling of Stress: Not at all  Social Connections: Moderately Isolated (01/28/2024)   Social Connection and Isolation Panel    Frequency of Communication with Friends and Family: More than three times a week    Frequency of Social Gatherings with Friends and Family: Once a week    Attends Religious Services: Never    Database Administrator or Organizations: No    Attends Banker Meetings: Not on file    Marital Status: Married  Depression (PHQ2-9): Low Risk (01/29/2024)   Depression (PHQ2-9)    PHQ-2 Score: 0  Alcohol Screen: Low Risk (01/28/2024)   Alcohol Screen    Last Alcohol Screening Score (AUDIT): 3  Housing: Low Risk (01/28/2024)   Epic    Unable to Pay for Housing  in the Last Year: No    Number of Times Moved in the Last Year: 0    Homeless in the Last Year: No  Utilities: Not on file  Health Literacy: Not on file   Allergies[1] Family History  Problem Relation Age of Onset   Prostate cancer Father 67   Diabetes Father        type 2   Cancer Father        Stage 4 throat cancer   Hyperlipidemia Father    Hypertension Father    Heart disease Father        MI x 4, CAD; first at age 83   Alzheimer's disease Father    Heart attack Paternal Grandfather    Colon cancer Neg Hx     Current Outpatient Medications (Cardiovascular):    pravastatin  (PRAVACHOL ) 10 MG tablet, TAKE 1 TABLET(10 MG) BY MOUTH DAILY  Current Outpatient Medications (Other):    triamcinolone  (KENALOG ) 0.025 % cream, Apply 1 Application  topically 2 (two) times daily. Review of Systems:  No headache, visual changes, nausea, vomiting, diarrhea, constipation, dizziness, abdominal pain, skin rash, fevers, chills, night sweats, weight loss, swollen lymph nodes, body aches, joint swelling, chest pain, shortness of breath, mood changes.  Objective  Blood pressure 120/84, pulse 80, height 6' (1.829 m), weight 219 lb (99.3 kg), SpO2 99%.   General: No apparent distress alert and oriented x3 mood and affect normal, dressed appropriately.  HEENT: Pupils equal, extraocular movements intact  Respiratory: Patient's speak in full sentences and does not appear short of breath  Cardiovascular: No lower extremity edema, non tender, no erythema  Elbow exam shows on the right side show patient does have swelling on the anterior medial aspect of the elbow.  Moves freely movable and feels.  It is quite tightness of the skin around it.  Minorly tender.  No erythema around it.  Potentially stop patient from 5 degrees of flexion.  Limited muscular skeletal ultrasound was performed and interpreted by CLAUDENE HUSSAR, M  Limited ultrasound shows that there is a hyperechoic noted in the area to the brachial radialis in the medial compartment of the elbow but superficial to the joint.  No abnormal vascularity noted.  Encapsulated.  Relatively significantly enlarged  Impression: Likely fatty mass but difficult to assess    Impression and Recommendations:     The above documentation has been reviewed and is accurate and complete Brayant Dorr M Gerardine Peltz, DO       [1] No Known Allergies  "

## 2024-08-01 ENCOUNTER — Ambulatory Visit: Admitting: Family Medicine

## 2024-08-04 ENCOUNTER — Ambulatory Visit

## 2024-08-04 DIAGNOSIS — M25521 Pain in right elbow: Secondary | ICD-10-CM

## 2024-08-06 ENCOUNTER — Ambulatory Visit: Admitting: Sports Medicine

## 2024-08-06 ENCOUNTER — Ambulatory Visit: Admitting: Physician Assistant

## 2024-08-09 ENCOUNTER — Ambulatory Visit: Payer: Self-pay | Admitting: Family Medicine
